# Patient Record
Sex: Male | Born: 1955 | Race: Black or African American | Hispanic: No | Marital: Single | Smoking: Current every day smoker
Health system: Southern US, Community
[De-identification: ages and names within clinical notes are randomized; demographics above are authoritative.]

## PROBLEM LIST (undated history)

## (undated) DIAGNOSIS — I5022 Chronic systolic (congestive) heart failure: Secondary | ICD-10-CM

## (undated) DIAGNOSIS — I509 Heart failure, unspecified: Secondary | ICD-10-CM

## (undated) DIAGNOSIS — G4733 Obstructive sleep apnea (adult) (pediatric): Secondary | ICD-10-CM

## (undated) DIAGNOSIS — I2510 Atherosclerotic heart disease of native coronary artery without angina pectoris: Secondary | ICD-10-CM

## (undated) DIAGNOSIS — I428 Other cardiomyopathies: Secondary | ICD-10-CM

## (undated) DIAGNOSIS — M1990 Unspecified osteoarthritis, unspecified site: Secondary | ICD-10-CM

## (undated) DIAGNOSIS — I10 Essential (primary) hypertension: Secondary | ICD-10-CM

## (undated) DIAGNOSIS — C801 Malignant (primary) neoplasm, unspecified: Secondary | ICD-10-CM

## (undated) DIAGNOSIS — I5032 Chronic diastolic (congestive) heart failure: Secondary | ICD-10-CM

## (undated) HISTORY — DX: Essential (primary) hypertension: I10

## (undated) HISTORY — DX: Morbid (severe) obesity due to excess calories: E66.01

## (undated) HISTORY — DX: Other cardiomyopathies: I42.8

## (undated) HISTORY — DX: Chronic systolic (congestive) heart failure: I50.22

## (undated) HISTORY — DX: Obstructive sleep apnea (adult) (pediatric): G47.33

## (undated) HISTORY — DX: Chronic diastolic (congestive) heart failure: I50.32

---

## 2002-05-19 ENCOUNTER — Encounter: Payer: Self-pay | Admitting: Cardiology

## 2002-05-19 ENCOUNTER — Inpatient Hospital Stay (HOSPITAL_COMMUNITY): Admission: EM | Admit: 2002-05-19 | Discharge: 2002-05-21 | Payer: Self-pay | Admitting: Cardiology

## 2004-01-12 ENCOUNTER — Encounter: Payer: Self-pay | Admitting: Cardiology

## 2004-08-02 ENCOUNTER — Ambulatory Visit: Payer: Self-pay | Admitting: Cardiology

## 2004-11-28 ENCOUNTER — Ambulatory Visit: Payer: Self-pay | Admitting: Cardiology

## 2005-03-19 ENCOUNTER — Ambulatory Visit: Payer: Self-pay | Admitting: Internal Medicine

## 2005-04-09 DIAGNOSIS — C801 Malignant (primary) neoplasm, unspecified: Secondary | ICD-10-CM

## 2005-04-09 HISTORY — PX: COLON SURGERY: SHX602

## 2005-04-09 HISTORY — DX: Malignant (primary) neoplasm, unspecified: C80.1

## 2005-04-10 ENCOUNTER — Ambulatory Visit: Payer: Self-pay | Admitting: Cardiology

## 2005-11-09 ENCOUNTER — Ambulatory Visit: Payer: Self-pay | Admitting: Cardiology

## 2005-11-19 ENCOUNTER — Ambulatory Visit: Payer: Self-pay | Admitting: Cardiology

## 2006-06-18 ENCOUNTER — Ambulatory Visit: Payer: Self-pay | Admitting: Cardiology

## 2007-02-24 ENCOUNTER — Ambulatory Visit: Payer: Self-pay | Admitting: Cardiology

## 2007-02-24 ENCOUNTER — Encounter: Payer: Self-pay | Admitting: Physician Assistant

## 2007-11-24 ENCOUNTER — Ambulatory Visit: Payer: Self-pay | Admitting: Cardiology

## 2007-11-24 ENCOUNTER — Encounter: Payer: Self-pay | Admitting: Physician Assistant

## 2008-02-16 ENCOUNTER — Encounter: Payer: Self-pay | Admitting: Cardiology

## 2008-08-03 ENCOUNTER — Ambulatory Visit: Payer: Self-pay | Admitting: Cardiology

## 2008-08-24 ENCOUNTER — Encounter: Payer: Self-pay | Admitting: Cardiology

## 2008-08-27 ENCOUNTER — Encounter: Payer: Self-pay | Admitting: Cardiology

## 2008-12-16 DIAGNOSIS — I10 Essential (primary) hypertension: Secondary | ICD-10-CM

## 2008-12-16 DIAGNOSIS — I5022 Chronic systolic (congestive) heart failure: Secondary | ICD-10-CM

## 2008-12-16 DIAGNOSIS — I429 Cardiomyopathy, unspecified: Secondary | ICD-10-CM | POA: Insufficient documentation

## 2010-01-19 ENCOUNTER — Ambulatory Visit: Payer: Self-pay | Admitting: Cardiology

## 2010-05-09 NOTE — Assessment & Plan Note (Signed)
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 The patient has been noncompliant with medical therapy and has developed some lower extremity edema and increased shortness of breath. His blood pressure also markedly elevated because of this lack of medications. He denies any palpitations or syncope. His spine to resume his medications tomorrow.  Preventive Screening-Counseling & Management  Alcohol-Tobacco     Smoking Status: current     Smoking Cessation Counseling: yes     Packs/Day: 1/2 PPD  Current Medications (verified): 1)  Aldactone 25 Mg Tabs (Spironolactone) .Marland Kitchen.. 1 Tablet By Mouth Once A Day 2)  Amlodipine Besylate 10 Mg Tabs (Amlodipine Besylate) .... Take 1 Tablet By Mouth Once A Day 3)  Furosemide 40 Mg Tabs (Furosemide) .... Take 1 Tablet By Mouth Once A Day 4)  Lisinopril 40 Mg Tabs (Lisinopril) .... Take 1 Tablet By Mouth Once A Day 5)  Metoprolol Tartrate 50 Mg Tabs (Metoprolol Tartrate) .... Take 1/2 Tablet By Mouth Twice A Day 6)  Potassium Chloride Crys Cr 20 Meq Cr-Tabs (Potassium Chloride Crys Cr) .... Take 1 Tablet By Mouth Once A Day 7)  Aspir-Low 81 Mg Tbec (Aspirin) .... Take 1 Tablet By Mouth Once A Day  Allergies (verified): 1)  Penicillin V Potassium (Penicillin V Potassium)  Comments:  Nurse/Medical Assistant: The patient's medications and allergies were verbally reviewed with the patient and were updated in the Medication and Allergy Lists.  Past History:  Past Medical History: Last updated: 12/16/2008 OBESITY-MORBID (>100') (ICD-278.01) HYPERTENSION, UNSPECIFIED (ICD-401.9) SYSTOLIC HEART FAILURE, CHRONIC (ICD-428.22) CARDIOMYOPATHY, SECONDARY (ICD-425.9) OSA  Social History: Last updated: 12/16/2008 Disabled  Tobacco Use - Yes.  Alcohol Use - no  Social History: Packs/Day:  1/2  PPD  Review of Systems       The patient complains of shortness of breath and leg swelling.  The patient denies fatigue, malaise, fever, weight gain/loss, vision loss, decreased hearing, hoarseness, chest pain, palpitations, prolonged cough, wheezing, sleep apnea, coughing up blood, abdominal pain, blood in stool, nausea, vomiting, diarrhea, heartburn, incontinence, blood in urine, muscle weakness, joint pain, rash, skin lesions, headache, fainting, dizziness, depression, anxiety, enlarged lymph nodes, easy bruising or bleeding, and environmental allergies.    Vital Signs:  Patient profile:   55 year old male Height:      65 inches Weight:      266 pounds BMI:     44.42 Pulse rate:   71 / minute BP sitting:   178 / 114  (left arm) Cuff size:   large  Vitals Entered By: Carlye Grippe (January 19, 2010 10:41 AM)  Nutrition Counseling: Patient's BMI is greater than 25 and therefore counseled on weight management options.  Physical Exam  Additional Exam:  General: Obese African American male head: Normocephalic and atraumatic eyes PERRLA/EOMI intact, conjunctiva and lids normal nose: No deformity or lesions mouth normal dentition, normal posterior pharynx neck: Supple, no JVD.  No masses, thyromegaly or abnormal cervical nodes lungs: Normal breath sounds bilaterally without wheezing.  Normal percussion heart: regular rate and rhythm with normal S1 and S2, no S3 or S4.  PMI is normal.  No pathological murmurs abdomen: Normal bowel sounds, abdomen is soft and nontender without masses, organomegaly or hernias noted.  No hepatosplenomegaly musculoskeletal: Back normal, normal gait muscle strength and tone normal pulsus: Pulse is normal in all 4 extremities Extremities:2+  peripheral pitting edema neurologic: Alert and oriented x 3 skin: Intact without lesions or rashes cervical nodes: No significant adenopathy psychologic: Normal affect    EKG  Procedure date:   01/19/2010  Findings:      normal sinus rhythm with first-degree AV block. Nonspecific ST-T wave changes.  Impression & Recommendations:  Problem # 1:  SYSTOLIC HEART FAILURE, CHRONIC (ICD-428.22) patient has evidence of volume overload but has been noncompliant with his medications. I asked him for his first dose of Lasix to take 40 mg twice a day and then resume 40 mg a day. His updated medication list for this problem includes:    Aldactone 25 Mg Tabs (Spironolactone) .Marland Kitchen... 1 tablet by mouth once a day    Amlodipine Besylate 10 Mg Tabs (Amlodipine besylate) .Marland Kitchen... Take 1 tablet by mouth once a day    Furosemide 40 Mg Tabs (Furosemide) .Marland Kitchen... Take 1 tablet by mouth once a day    Lisinopril 40 Mg Tabs (Lisinopril) .Marland Kitchen... Take 1 tablet by mouth once a day    Metoprolol Tartrate 50 Mg Tabs (Metoprolol tartrate) .Marland Kitchen... Take 1/2 tablet by mouth twice a day    Aspir-low 81 Mg Tbec (Aspirin) .Marland Kitchen... Take 1 tablet by mouth once a day  Problem # 2:  HYPERTENSION, UNSPECIFIED (ICD-401.9) BP markedly elevated but he should improve after initiation of his medications. Also asked the patient to stop his potassium supplements in conjunction with spironolactone. His updated medication list for this problem includes:    Aldactone 25 Mg Tabs (Spironolactone) .Marland Kitchen... 1 tablet by mouth once a day    Amlodipine Besylate 10 Mg Tabs (Amlodipine besylate) .Marland Kitchen... Take 1 tablet by mouth once a day    Furosemide 40 Mg Tabs (Furosemide) .Marland Kitchen... Take 1 tablet by mouth once a day    Lisinopril 40 Mg Tabs (Lisinopril) .Marland Kitchen... Take 1 tablet by mouth once a day    Metoprolol Tartrate 50 Mg Tabs (Metoprolol tartrate) .Marland Kitchen... Take 1/2 tablet by mouth twice a day    Aspir-low 81 Mg Tbec (Aspirin) .Marland Kitchen... Take 1 tablet by mouth once a day  Orders: EKG w/ Interpretation (93000)  Problem # 3:  CARDIOMYOPATHY, SECONDARY (ICD-425.9) patient has a nonischemic cardiomyopathy no further ischemia workup is needed. His updated medication list  for this problem includes:    Aldactone 25 Mg Tabs (Spironolactone) .Marland Kitchen... 1 tablet by mouth once a day    Amlodipine Besylate 10 Mg Tabs (Amlodipine besylate) .Marland Kitchen... Take 1 tablet by mouth once a day    Furosemide 40 Mg Tabs (Furosemide) .Marland Kitchen... Take 1 tablet by mouth once a day    Lisinopril 40 Mg Tabs (Lisinopril) .Marland Kitchen... Take 1 tablet by mouth once a day    Metoprolol Tartrate 50 Mg Tabs (Metoprolol tartrate) .Marland Kitchen... Take 1/2 tablet by mouth twice a day    Aspir-low 81 Mg Tbec (Aspirin) .Marland Kitchen... Take 1 tablet by mouth once a day  Orders: EKG w/ Interpretation (93000)  Patient Instructions: 1)  Resume medications as discussed with Dr. Earnestine Leys. Take Lasix (furosemide) 40mg  two times a day for 1 day and then resume 40mg  once daily. 2)  Stop Potassium.  3)  Your physician wants you to follow-up in: 6 months. You will receive a reminder letter in the mail one-two months in advance. If you don't receive a letter, please call our office to schedule the follow-up appointment. 4)  Your physician discussed the hazards of tobacco use.  Tobacco use cessation is recommended and techniques and options to  help you quit were discussed.

## 2010-08-17 ENCOUNTER — Other Ambulatory Visit: Payer: Self-pay | Admitting: *Deleted

## 2010-08-17 MED ORDER — LISINOPRIL 40 MG PO TABS: 40.0000 mg | ORAL_TABLET | Freq: Every day | ORAL | Status: DC

## 2010-08-22 NOTE — Assessment & Plan Note (Signed)
[facility]                          [facility] NOTE   NAME:[name] [name], [name] [name]                   MRN:          [id number]  DATE:08/03/2008                            DOB:          [birth year]    HISTORY OF PRESENT ILLNESS:  The patient is a very pleasant 55 year old  African American male with a nonischemic cardiomyopathy; however, his  ejection fraction more recently in August 2007 was approximately 50%.  At one point, he had severe LV dysfunction with ejection fraction of 10-  15%, likely secondary to hypertensive heart disease.  He is currently at  NYHA class I-II.  He denies any chest pain or shortness of breath.  His  main complaint is that he is bothered by bilateral hip pain and is going  to see an orthopedic physician for further evaluation.  Unfortunately,  he continues to smoke 2 packs every 2 days.  From a cardiovascular  standpoint; however, he has remained stable.   MEDICATIONS:  1. Aldactone 25 mg p.o. daily.  2. Zestril 40 mg p.o. daily.  3. Lasix 40 mg p.o. daily.  4. Aspirin 81 mg p.o. daily.  5. Lopressor 50 mg half tablet p.o. b.i.d.  6. K-Dur 20 mEq.  7. Norvasc 10 mg p.o. daily.   PHYSICAL EXAMINATION:  VITAL SIGNS:  Blood pressure 106/73, heart rate  70, and weight 254 pounds.  NECK:  Normal carotid upstroke and no carotid bruits.  No thyromegaly.  No nodular thyroid.  LUNGS:  Clear breath sounds bilaterally.  HEART:  Regular rate and rhythm with normal S1 and S2.  No murmur, rub,  or gallop.  ABDOMEN:  Soft, nontender.  No rebound or guarding.  Good bowel sounds.  EXTREMITIES:  No cyanosis, clubbing, or edema.  NEURO:  The patient is alert, oriented, and grossly nonfocal.  EXTREMITIES:  Otherwise within normal limits.   PROBLEMS LIST:  1. Nonischemic cardiomyopathy.      a.     Ejection fraction 50%.      b.     Ejection fraction in 2004, 10-15%.      c.     Normal coronary arteries in February 2004.  2.  Chronic systolic heart failure without any recent exacerbation, [place]      [place] Heart Association class I to II.  3. Long-standing tobacco abuse.  4. Hypertension, controlled.  5. Morbid obesity.  6. Obstructive sleep apnea, noncompliant.   PLAN:  1. I have asked the patient to be compliant with the CPAP and also      stop smoking.  2. He needs some blood work done, will include CBC, CMET, and a BNP.  3. The patient's blood pressure is well controlled as long as he      remains compliant with his medication but sometimes difficult      because of financial constraints.  However, I do not think he needs      ischemia workup at this point in time.     Learta Codding, MD,FACC  Electronically Signed    GED/MedQ  DD: 08/03/2008  DT: 08/04/2008  Job #: [id number]

## 2010-08-22 NOTE — Assessment & Plan Note (Signed)
[facility]                          [facility] NOTE   NAME:[name] [name], [name] [name]                   MRN:          [id number]  DATE:02/24/2007                            DOB:          06-26-1955    PRIMARY CARDIOLOGIST:  Learta Codding, MD,FACC   REASON FOR VISIT:  A 6 month follow up.   HISTORY OF PRESENT ILLNESS:  Since last seen here in the clinic this  past March, the patient continues to report no exertional angina  pectoris or tachy palpitations.  He also presents with no sign/symptoms  suggestive of decompensated heart failure.  Although he reports  compliance with his medications, he did not take his morning doses of  medications today.  He also continues to smoke.   The patient has not had any blood work since January this year.  He is  on Aldactone as well as supplemental potassium, and his previous  potassium level was 3.9.   The patient does not weigh himself daily, but does keep a check on his  volume status.  He states that he has not had any recurrent lower  extremity edema, as when he initially presented with congestive heart  failure in 2004.  He also does admit to some occasional dietary sodium  indiscretion.   Electrocardiogram today reveals NSR with first degree AV block at 90 bpm  and persistent nonspecific ST abnormalities in the inferior lateral  leads, somewhat more pronounced than from a year ago.   CURRENT MEDICATIONS:  1. Aldactone 25 mg daily.  2. Lopressor 25 mg b.i.d.  3. Norvasc 5 mg daily.  4. Zestril 40 mg daily.  5. Lasix 40 mg daily.  6. K-Dur 20 q.o.d.  7. Aspirin 81 mg daily.  8. CPAP p.r.n.   PHYSICAL EXAMINATION:  VITAL SIGNS:  Blood pressure 137/90, pulse 88 and  regular, weight 260.6 (up three pounds).  GENERAL:  A 55 year old male, morbidly obese, sitting upright in no  distress.  HEENT:  Normocephalic, atraumatic.  NECK:  Palpable bilateral carotid pulses; unable to assess JVD  secondary  to neck girth.  LUNGS:  Diminished breath sounds at bases but without crackles or  wheezes.  HEART:  Regular rate and rhythm (S1, S2).  No significant murmurs.  No  rubs.  ABDOMEN:  Protuberant, nontender.  EXTREMITIES:  No pedal edema.  NEUROLOGICAL:  No focal deficits.   IMPRESSION:  1. Nonischemic cardiomyopathy.      a.     Ejection fraction 50%; no focal WMAs by 2D echo, August       2007.      b.     History of severe LVD (ejection fraction 50%) with normal       coronary arteries by cardiac catheterization in February, 2004.  2. Chronic systolic heart failure.      a.     Current NYHA class I-II.  3. Long-standing tobacco.  4. Hypertension.  5. History of renal insufficiency.  6. Morbid obesity.  7. History of colon cancer status post right hemicolectomy, December      2006.  8. Obstructive sleep apnea,  CPAP noncompliant.   PLAN:  1. Complete surveillance blood work with C-Met, BMP, CBC and TSH      levels.  2. Prescribe Chantix for aggressive smoking cessation treatment.  The      patient states that he has never tried a treatment modality in the      past, but is quite willing to do so at the present time.  3. Patient strongly encouraged to establish with a primary care      physician.  4. Return clinic follow up with myself and Dr. Andee Lineman in six months.      Gene Serpe, PA-C  Electronically Signed      Learta Codding, MD,FACC  Electronically Signed   GS/MedQ  DD: 02/24/2007  DT: 02/25/2007  Job #: [id number]

## 2010-08-22 NOTE — Assessment & Plan Note (Signed)
[facility]                          [facility]   NAME:[name] [name] HARN                   MRN:          [id number]  DATE:11/24/2007                            DOB:          [birth year]    PRIMARY CARDIOLOGIST:  Learta Codding, MD, [facility]   REASON FOR VISIT:  Six-month followup.   Since last seen here in the clinic in November 2008, [name] [name]  states that he is feeling the best in years.  He reports compliance  with his medications and refrains from any added salt in his diet.  Unfortunately, however, he continues to smoke, but now only smokes  approximately 1 pack a week.  He previously smoked as much as 2 packs a  day.   The patient denies any exertional chest discomfort.  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  He  has not yet had a formal evaluation for this, however.  The patient is  on total disability.   [name] [name] denies any recurrent PND or orthopnea.  He has stable  lower extremity edema, much improved as compared to previous  presentations.   The patient has not had any repeat blood work, since my last orders in  November.  At that time, CBC, renal function, electrolytes, and TSH were  all within normal limits.  Of note, BNP level was 20.   EKG today reveals an SR at 84 bpm with chronic first-degree AV block;  chronic, diffuse ST-segment abnormalities.   CURRENT MEDICATIONS:  1. Aspirin 81 daily.  2. Lasix 40 daily.  3. K-Dur 20 every other day.  4. Zestril 40 daily.  5. Norvasc 5 daily.  6. Aldactone 25 daily.  7. Lopressor 25 b.i.d.   PHYSICAL EXAMINATION:  VITAL SIGNS:  Blood pressure 130/83, pulse 86,  regular; weight 253 (down 7).  GENERAL:  A 55 year old male, moderately obese, sitting upright, in no  distress.  HEENT:  Normocephalic, atraumatic.  NECK:  Palpable.  Carotid pulses without bruits; no JVD at 90 degrees.  LUNGS:  Clear to auscultation  bilaterally.  HEART:  RRR (S1, S2), positive S4.  No significant murmurs.  No rubs.  ABDOMEN:  Protuberant, nontender.  EXTREMITIES:  Palpable posterior tibialis pulses with 1+ pedal edema.  NEURO:  No focal deficits.   IMPRESSION:  1. Nonischemic cardiomyopathy.  1a.  Ejection fraction 50%; no focal WMAs by echocardiogram, August  2007.  1b.  History of severe left ventricular dysfunction (ejection fraction  10-15% by 2D echocardiogram in 2004).  1c.  Normal coronary arteries, February 2004.  1. Chronic systolic heart failure.  2a.  Current [place] Heart Association Class I-II.  1. Longstanding tobacco.  2. Hypertension.  3. Morbid obesity.  4. Obstructive sleep apnea, CPAP noncompliant.   PLAN:  1. Uptitrate Norvasc to 10 mg daily for more aggressive blood pressure      control.  2. Surveillance blood work with a CMET.  3. Return clinic followup with myself again in 6 months.      Gene Serpe, PA-C  Electronically Signed  Madolyn Frieze Jens Som, MD, [facility]  Electronically Signed   GS/MedQ  DD: 11/24/2007  DT: 11/25/2007  Job #: [id number]

## 2010-08-25 NOTE — Discharge Summary (Signed)
NAME:  [name] [name], [name] [name]                    ACCOUNT NO.:  [id number]   MEDICAL RECORD NO.:  [id number]                   PATIENT TYPE:  INP   LOCATION:  2031                                 FACILITY:  [facility]   PHYSICIAN:  R[place]ide Bing, M.D. [facility]           DATE OF BIRTH:  [birth year]   DATE OF ADMISSION:  05/19/2002  DATE OF DISCHARGE:  05/21/2002                           DISCHARGE SUMMARY - REFERRING   DISCHARGE DIAGNOSES:  1. 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  2. Hypertension, treated.  3. Remote history of asthma.  4. Tobacco abuse.   HOSPITAL COURSE:  The patient is a 55 year old male patient who was admitted  to [facility] on May 17, 2002 under the service of Dr. Linna Darner.  He  was admitted with shortness of breath and lower extremity edema.  During  that admission, he was noted to have markedly elevated blood pressures and  troponin levels were mildly elevated at 0.09.  His 12-lead EKG revealed  normal sinus rhythm with nonspecific ST-T wave changes.  Ultimately, a  cardiac consultation was obtained on May 18, 2002 and a 2-D  echocardiogram revealed an EF of about 15%.  On May 19, 2002, the  patient was transferred to [facility] and the following day, underwent  coronary angiography under the care of Dr. Charlton Haws.  He was found to  have normal coronary arteries but the LV was not performed secondary to  increased filling pressures.  Again, a 15% EF was noted by echocardiogram.   His nonischemic cardiomyopathy was felt to be secondary to hypertension and  this was aggressively treated.  In addition, he was noted to have  hypokalemia during his admission and this was aggressively treated.   By discharge, his blood pressure was 110/80, pulse 88, respirations 20, O2  saturation 99% on room air.  He was planned for discharge to home on the  following medications:   DISCHARGE  MEDICATIONS:  1. Aldactone 25 mg a day.  2. Enteric-coated aspirin 325 mg a day.  3. Lopressor 50 mg one p.o. b.i.d.  4. Norvasc 5 mg one p.o. daily.  5. Zestril 40 mg one p.o. daily.  6. Lasix 40 mg one p.o. daily.  7. Potassium 20 mEq two tablets daily.  8. Tylenol one to two tablets every six hours as needed for pain.   ACTIVITY:  Activity as tolerated.   DIET:  Remain on a low-fat, no-salt diet.  Limit fluid intake to 2 L of  fluid per day.    FOLLOWUP:  Call for any questions or concerns and follow up with [facility] in [place] on May 26, 2002 at 1:30 p.m.  At this point, he will  need to have a BMET checked to ensure his hypokalemia has not recurred.     Guy Franco, P.A. [facility]  [place] Bing, M.D. [facility]    LB/MEDQ  D:  05/21/2002  T:  05/21/2002  Job:  [id number]   cc:   [address], [address], C/o [facility], Jonelle Sidle M.D., [place], [place]  [address]   [address], [place], [place]  [address] Stormy Card.D.

## 2010-08-25 NOTE — Cardiovascular Report (Signed)
   NAME:  [name] [name], [name] [name]                    ACCOUNT NO.:  [id number]   MEDICAL RECORD NO.:  [id number]                   PATIENT TYPE:  INP   LOCATION:  2031                                 FACILITY:  [facility]   PHYSICIAN:  Charlton Haws, M.D.                  DATE OF BIRTH:  07-14-1955   DATE OF PROCEDURE:  DATE OF DISCHARGE:  05/21/2002                              CARDIAC CATHETERIZATION   INDICATIONS FOR PROCEDURE:  Cardiomyopathy, rule out ischemic nature.   DESCRIPTION OF PROCEDURE:  The catheterization is done from the right  femoral artery and vein. The left main coronary artery was normal. The left  anterior descending artery was normal. The circumflex coronary was normal.  The right coronary artery was normal.   The patient had elevated filling pressures and a ventriculography was not  performed. He had an ejection fraction of 15% by echocardiography. Right  heart catheterization was done to assess his cardiomyopathy, and adequacy of  medical therapy. Mean right atrial pressure is 20, RV pressure is 56/21, PA  pressure is 52/35, meal pulmonary capillary wedge pressure was 30. Cardiac  index was 3.1 liters per minute/m2.   IMPRESSION:  The patient has a nonischemic cardiomyopathy, probably  hypertensive in nature. He will need his medications increased  and adjusted  appropriately, since his filling pressures are still high. He will probably  be able to be discharged in about 48 hours once he has been diuresed more  and his medications are adjusted. He will follow up in [place] with Dr.  Diona Browner.                                               Charlton Haws, M.D.    PN/MEDQ  D:  09/07/2002  T:  09/07/2002  Job:  [id number]   cc:   Dr. Juventino Slovak

## 2010-08-25 NOTE — Assessment & Plan Note (Signed)
[name] [name]                          [name] [name]   NAME:[name], [name] [name] [name]                   MRN:          [id number]  DATE:06/18/2006                            DOB:          [birth year]    PRIMARY CARDIOLOGIST:  Dr. Lewayne Bunting.   REASON FOR VISIT:  Scheduled 90-month followup.  Please refer to my  previous clinic [name] of August, 2007 for full details.   The patient reports no interim development of signs or symptoms  suggestive of unstable angina pectoris or decompensated heart failure.  He reports compliance with his medications but, unfortunately, continues  to smoke, albeit at a significantly reduced level.   The patient had recent blood work in January of this year, per Dr. Cleone Slim,  and this shows normal renal function, potassium of 3.9, and normal CBC.   The patient is followed by Dr. Cleone Slim for a history of colon cancer and is  status post right hemicolectomy.   CURRENT MEDICATIONS:  1. Aldactone 25 daily.  2. Lopressor 50 daily.  3. Norvasc 5 daily.  4. Zestril 40 daily.  5. Lasix 40 daily.  6. K-Dur 20 daily.  7. Aspirin 81 daily.  8. CPAP p.r.n.   PHYSICAL EXAMINATION:  Blood pressure 120/78, pulse 81 and regular.  Weight 257.8 (down 2 pounds).  GENERAL:  A 55 year old male, morbidly obese.  Sitting upright in no  distress.  NECK:  Palpable carotid pulses without bruits; no JVD.  LUNGS:  Clear to auscultation in all fields.  HEART:  Regular rate and rhythm (S1, S2).  No significant murmurs.  No  rubs.  ABDOMEN:  Protuberant.  Nontender.  EXTREMITIES:  No edema.  NEURO:  No focal deficit.   IMPRESSION:  1. Nonischemic cardiomyopathy.      a.     Ejection fraction 50% by 2D echo August of 2007.      b.     History of severe left ventricular dysfunction with ejection       fraction of 15% by cardiac catheterization 2004.      c.     Previously in WARCEF study (discontinued secondary to       improved ejection  fraction).  2. Hypertensive heart disease.  3. Morbid obesity.  4. History of renal insufficiency.      a.     Normalized by most recent blood work.  5. Ongoing tobacco.  6. History of colon cancer.      a.     Status post right hemicolectomy.   PLAN:  1. Change metoprolol to 25 b.i.d. for more appropriate dosing regimen.      Otherwise, continue current medication regimen.  2. Schedule return clinic followup with myself and Dr. Andee Lineman in 6      months.  3. Of [name], the patient once again strongly encouraged to stop smoking      tobacco.  He states that if he has not done so by the time of our      next visit, he is amenable to trying Chantix.  4. The patient also strongly encouraged to establish with a  primary      care physician.      Gene Serpe, PA-C  Electronically Signed      Learta Codding, MD,FACC  Electronically Signed   GS/MedQ  DD: 06/18/2006  DT: 06/20/2006  Job #: [id number]

## 2010-08-25 NOTE — Cardiovascular Report (Signed)
NAME:  [name] [name], [name] [name] NO.:  [id number]   MEDICAL RECORD NO.:  [id number]                   PATIENT TYPE:   LOCATION:                                       FACILITY:  [facility]   PHYSICIAN:  Charlton Haws, M.D. [facility]              DATE OF BIRTH:   DATE OF PROCEDURE:  05/20/2002  DATE OF DISCHARGE:                              CARDIAC CATHETERIZATION   PROCEDURE:  Coronary arteriography.   INDICATION:  Dyspnea, left-sided cardiac failure with EF of 15% by  echocardiogram.   DESCRIPTION OF PROCEDURE:  Cine catheterization was done from the right  femoral artery and vein.  Right heart catheterization was performed due to  the patient's low ejection fraction, clinical CHF and BNP greater than 900.   FINDINGS:  The left main coronary artery was normal.   Left anterior descending artery was normal.   Circumflex coronary artery was nondominant and normal.   Right coronary artery was dominant and it was normal.   Ventriculography was not performed due to the patient's high filling  pressure.  Mean right atrial pressure was 20.  RV pressure was 56/21.  PA  pressure was 52/35.  Mean pulmonary capillary wedge pressure was 30.  Cardiac index was in the 3.1 L/min per square meter range.   IMPRESSION:  The patient appeared to have nonischemic cardiomyopathy, most  likely from poor compliance with his blood pressure medications.   His filling pressures are still somewhat high.  We will increase his  Lisinopril to 40 mg a day and put him on b.i.d. Lasix with potassium  supplementation.  As long as his groin heals well, I suspect he will be  ready for discharge in 48 hours and he can follow up with Dr. Linna Darner and Dr.  Jonelle Sidle in [place].   The patient tolerated the procedure well.                                               Charlton Haws, M.D. [facility]    PN/MEDQ  D:  05/20/2002  T:  05/20/2002  Job:  [id number]

## 2010-08-25 NOTE — Assessment & Plan Note (Signed)
[facility]                            [facility] OFFICE NOTE   NAME:[name], [name]                    MRN:          [id number]  DATE:11/09/2005                            DOB:          08-26-1955    REASON FOR OFFICE VISIT:  [name] [name] is a 55 year old male, with a history  of non-ischemic cardiomyopathy, who now presents for a scheduled six month  followup.   The patient was last seen here in the office in January 2007, by Arnette Felts,  P.A.-C., at which time he was taken off Coumadin as part of the [facility]  study, given that his ejection fraction had improved from as low as 10-15%  to a most recent level of 45-55%.  He was kept on low dose aspirin.   The patient's history also notable for hypertension, sleep apnea, renal  insufficiency, and ongoing tobacco smoking.   The patient also underwent recent right hemicolectomy for treatment of colon  cancer in December 2006.   The patient had a catheterization in February 2004, revealing  angiographically normal coronary arteries with an ejection fraction of 15%.  It was felt that his cardiomyopathy was most likely secondary to  uncontrolled hypertension.   The patient reports compliance with his medications, but continues to smoke.  He is unemployed and is on total disability secondary to his cardiomyopathy.  He denies any exertional angina pectoris and reports no symptoms suggestive  of decompensated heart failure.  He has occasional episodic periods of  dizziness, approximately every few months, but these appear to be vasovagal  in etiology:  He describes the sensation of nausea associated with vomiting  which precedes these.  There is no associated tachy-palpitations nor any  chest pain.   Electrocardiogram today reveals normal sinus rhythm with first degree atrial-  ventricular block at 90 BPM, with left axis deviation and nonspecific ST  abnormalities.   CURRENT MEDICATIONS:  1.   Aldactone 25 mg daily.  2.  Lopressor 50 mg daily.  3.  Norvasc 5 mg daily.  4.  Zestril 40 mg daily.  5.  Lasix 40 mg daily.  6.  K-Dur 20 mEq daily.  7.  Aspirin 81 mg daily.  8.  CPAP on occasion.   PHYSICAL EXAMINATION:  VITAL SIGNS:  Blood pressure 140/80, pulse 90 and  regular, weight 259.8 (down two pounds).  GENERAL:  A 55 year old male, obese, sitting upright in no distress.  NECK:  Palpable carotid pulses without bruits;  unable to assess for JVD  secondary to neck girth.  LUNGS:  Diminished breath sounds at bases, though without crackles or  wheezes.  HEART:  Regular rate and rhythm (S1 and S2), positive S4, no S3.  No  significant murmurs.  ABDOMEN:  Protuberant, nontender.  EXTREMITIES:  Palpable dorsalis pedis pulses with trace edema.   IMPRESSION:  1.  Non-ischemic cardiomyopathy.      1.  History of severe left ventricular dysfunction (15% by          catheterization), improved to 45-55% by most recent echocardiogram  of May 2005.  2.  Hypertensive heart disease.  3.  Status post WARCEF study.      1.  Coumadin discontinued secondary to improved ejection fraction.  4.  Morbid obesity.  5.  History of renal insufficiency.  6.  Ongoing tobacco.  7.  History of colon cancer.      1.  Status post right hemicolectomy.   PLAN:  1.  Schedule 2-D echocardiogram for reassessment of left ventricular      function.  2.  Check a complete metabolic profile at that time for close monitoring of      electrolytes and renal function given that he is on Aldactone, Lasix,      and an ACE inhibitor.  3.  We will up-titrate Lopressor to 50 mg b.i.d. for better blood pressure      and basal heart rate control.  4.  Schedule a return clinic followup in six months with Dr. Lewayne Bunting.  5.  I also strongly encouraged [name] [name] to stop smoking tobacco on his      own, and to initiate a regular walking regimen for blood pressure and      weight control.                                    Gene Serpe, PA-C                                Learta Codding, MD, [facility]   GS/MedQ  DD:  11/09/2005  DT:  11/09/2005  Job #:  [id number]

## 2010-11-09 ENCOUNTER — Encounter: Payer: Self-pay | Admitting: Cardiology

## 2010-11-22 ENCOUNTER — Encounter: Payer: Self-pay | Admitting: Cardiology

## 2010-11-22 ENCOUNTER — Ambulatory Visit (INDEPENDENT_AMBULATORY_CARE_PROVIDER_SITE_OTHER): Payer: Medicare Other | Admitting: Cardiology

## 2010-11-22 VITALS — BP 157/95 | HR 76 | Ht 65.0 in | Wt 265.0 lb

## 2010-11-22 DIAGNOSIS — I10 Essential (primary) hypertension: Secondary | ICD-10-CM

## 2010-11-22 DIAGNOSIS — G4733 Obstructive sleep apnea (adult) (pediatric): Secondary | ICD-10-CM

## 2010-11-22 DIAGNOSIS — I5022 Chronic systolic (congestive) heart failure: Secondary | ICD-10-CM

## 2010-11-22 MED ORDER — CHLORTHALIDONE 25 MG PO TABS: 25.0000 mg | ORAL_TABLET | Freq: Every day | ORAL | Status: DC

## 2010-11-22 MED ORDER — CARVEDILOL 6.25 MG PO TABS: 6.2500 mg | ORAL_TABLET | Freq: Two times a day (BID) | ORAL | Status: DC

## 2010-11-22 NOTE — Assessment & Plan Note (Signed)
I also asked the patient to resume his CPAP device given his history of obstructive sleep apnea.

## 2010-11-22 NOTE — Progress Notes (Signed)
HPI The patient is a very pleasant 55 year old male with history of nonischemic cardiomyopathy, latest ejection fraction 50%. In the past the patient had been noncompliant with his medical therapy but states that now he takes his medications on a regular basis. Unfortunately, he is again hypertensive come to the clinic today. When I asked him what his usual blood pressures at home were he responded to me that they were typically around 160-170 mmHg and tried to explain to him that this is significant blood pressure elevation. The patient also has a history of obstructive sleep apnea but does not use his CPAP device in several years. This was initially prescribed by Arnette Felts in  our office. We performed a bedside echocardiogram which showed an ejection fraction 50-55%, without segmental wall motion abnormalities, but with some right ventricular enlargement albeit normal RV function. Mitral and aortic valve appear to be within normal limits.  Allergies  Allergen Reactions  . Penicillins     REACTION: rash    Current Outpatient Prescriptions on File Prior to Visit  Medication Sig Dispense Refill  . amLODipine (NORVASC) 10 MG tablet Take 10 mg by mouth daily.        Marland Kitchen aspirin 81 MG EC tablet Take 81 mg by mouth daily.        Marland Kitchen lisinopril (PRINIVIL,ZESTRIL) 40 MG tablet Take 1 tablet (40 mg total) by mouth daily. PATIENT NEED OFFICE VISIT  30 tablet  0    Past Medical History  Diagnosis Date  . Morbid obesity   . HTN (hypertension)   . Chronic systolic heart failure   . Cardiomyopathy     secondary   . OSA (obstructive sleep apnea)     No past surgical history on file.  No family history on file.  History   Social History  . Marital Status: Single    Spouse Name: N/A    Number of Children: N/A  . Years of Education: N/A   Occupational History  . Not on file.   Social History Main Topics  . Smoking status: Current Everyday Smoker -- 0.5 packs/day for 40 years    Types:  Cigarettes  . Smokeless tobacco: Never Used  . Alcohol Use: No  . Drug Use: Not on file  . Sexually Active: Not on file   Other Topics Concern  . Not on file   Social History Narrative   Disabled.    VHQ:IONGEXBMW positives as outlined above. The remainder of the 18  point review of systems is negative  PHYSICAL EXAM BP 157/95  Pulse 76  Ht 5\' 5"  (1.651 m)  Wt 265 lb (120.203 kg)  BMI 44.10 kg/m2  SpO2 96%  General: Well-developed, well-nourished in no distress Head: Normocephalic and atraumatic Eyes:PERRLA/EOMI intact, conjunctiva and lids normal Ears: No deformity or lesions Mouth:normal dentition, normal posterior pharynx Neck: Supple, no JVD.  No masses, thyromegaly or abnormal cervical nodes Lungs: Normal breath sounds bilaterally without wheezing.  Normal percussion Cardiac: regular rate and rhythm with normal S1 and S2, no S3 or S4.  PMI is normal.  No pathological murmurs Abdomen: Normal bowel sounds, abdomen is soft and nontender without masses, organomegaly or hernias noted.  No hepatosplenomegaly MSK: Back normal, normal gait muscle strength and tone normal Vascular: Pulse is normal in all 4 extremities Extremities: No peripheral pitting edema Neurologic: Alert and oriented x 3 Skin: Intact without lesions or rashes Lymphatics: No significant adenopathyPsychologic: Normal affect  ECG: Not available  Limited bedside echocardiogram: See details above  ASSESSMENT AND PLAN

## 2010-11-22 NOTE — Assessment & Plan Note (Signed)
We'll DC Lasix and change to chlorthalidone 25 mg a day for better blood pressure control I told the patient however did careful monitor his blood pressure as well as his weight and if there is a significant increase in weight or edema he needs to call her office and we can give him additional when necessary doses of Lasix. I also stop metoprolol in favor of carvedilol 6.25 mg by mouth twice a day which can be further uptitrate as needed.

## 2010-11-22 NOTE — Assessment & Plan Note (Signed)
Echocardiogram stable and essentially near normal LV function

## 2010-11-22 NOTE — Patient Instructions (Addendum)
   Stop Lasix   Change to Chlorthalidone 25mg  daily  Stop Metoprolol   Change to Carvedilol 6.25mg  twice a day  Patient to call with blood pressure readings after one week on medications.  Resume BIPAP Your physician wants you to follow up in: 6 months.  You will receive a reminder letter in the mail one-two months in advance.  If you don't receive a letter, please call our office to schedule the follow up appointment

## 2011-01-18 ENCOUNTER — Other Ambulatory Visit: Payer: Self-pay | Admitting: *Deleted

## 2011-01-18 MED ORDER — LISINOPRIL 40 MG PO TABS: 40.0000 mg | ORAL_TABLET | Freq: Every day | ORAL | Status: DC

## 2011-09-20 ENCOUNTER — Other Ambulatory Visit: Payer: Self-pay | Admitting: Cardiology

## 2011-11-19 ENCOUNTER — Encounter: Payer: Self-pay | Admitting: Cardiology

## 2011-11-19 ENCOUNTER — Ambulatory Visit (INDEPENDENT_AMBULATORY_CARE_PROVIDER_SITE_OTHER): Payer: Medicare Other | Admitting: Cardiology

## 2011-11-19 VITALS — BP 110/80 | HR 77 | Ht 65.0 in | Wt 245.2 lb

## 2011-11-19 DIAGNOSIS — G4733 Obstructive sleep apnea (adult) (pediatric): Secondary | ICD-10-CM

## 2011-11-19 DIAGNOSIS — I10 Essential (primary) hypertension: Secondary | ICD-10-CM

## 2011-11-19 DIAGNOSIS — M25559 Pain in unspecified hip: Secondary | ICD-10-CM

## 2011-11-19 DIAGNOSIS — I429 Cardiomyopathy, unspecified: Secondary | ICD-10-CM

## 2011-11-19 MED ORDER — CARVEDILOL 6.25 MG PO TABS: 6.2500 mg | ORAL_TABLET | Freq: Two times a day (BID) | ORAL | Status: DC

## 2011-11-19 NOTE — Assessment & Plan Note (Signed)
Patient uses CPAP only intermittently. I tried to encourage her to use that much as possible

## 2011-11-19 NOTE — Assessment & Plan Note (Signed)
Patient is doing well. No heart failure exacerbation each requiring admission to the ER or hospitalization. Patient has been compliant with his medications. He has no orthopnea PND or shortness of breath. We will continue his current medications and refill carvedilol. No need for followup echocardiogram at this point

## 2011-11-19 NOTE — Assessment & Plan Note (Signed)
Patient has lost 25 pounds and encourage him to lose more weight by decreasing his carbohydrate and increasing his exercise pattern

## 2011-11-19 NOTE — Assessment & Plan Note (Addendum)
Chronic but worsened. Patient has appointment the next few days with the orthopedic surgeons. He may need preoperative clearance. We'll await further recommendations from surgeons.

## 2011-11-19 NOTE — Progress Notes (Signed)
[name] Bottoms, MD, [facility] Board Certified in Adult Cardiovascular Medicine,Internal Medicine and Critical Care Medicine    CC:   History of nonischemic cardiomyopathy                                                                               HPI:    Patient is a 56 year old male with a history of nonischemic cardiomyopathy. He has been doing well. He has not required any hospitalizations for heart failure. He reports no orthopnea PND palpitations or syncope. He has lost approximately 25 pounds but still is overweight. He also sleep apnea and uses CPAP only intermittently. He is right ventricular enlargement by echocardiogram, albeit normal RV function in his left ventricular function is significantly improved after better blood pressure control. Last office visit he was very hypertensive but during his office visit his blood pressure stable. Clinically he only reports symptoms of left hip pain for which he is going to see the orthopedic surgeons. No other cardiovascular complaints.       PMH: reviewed and listed in Problem List in Electronic Records (and see below) Past Medical History  Diagnosis Date  . Morbid obesity   . HTN (hypertension)   . Chronic systolic heart failure   . Cardiomyopathy     secondary   . OSA (obstructive sleep apnea)    No past surgical history on file.  Allergies/SH/FHX : available in Electronic Records for review  Allergies  Allergen Reactions  . Penicillins     REACTION: rash   History   Social History  . Marital Status: Single    Spouse Name: N/A    Number of Children: N/A  . Years of Education: N/A   Occupational History  . Not on file.   Social History Main Topics  . Smoking status: Current Everyday Smoker -- 0.5 packs/day for 40 years    Types: Cigarettes  . Smokeless tobacco: Never Used  . Alcohol Use: No  . Drug Use: Not on file  . Sexually Active: Not on file   Other Topics Concern  . Not on file   Social History  Narrative   Disabled.    No family history on file.  Medications: Current Outpatient Prescriptions  Medication Sig Dispense Refill  . amLODipine (NORVASC) 10 MG tablet Take 10 mg by mouth daily.        Marland Kitchen aspirin 81 MG EC tablet Take 81 mg by mouth daily.        . carvedilol (COREG) 6.25 MG tablet Take 1 tablet (6.25 mg total) by mouth 2 (two) times daily.  60 tablet  6  . chlorthalidone (HYGROTON) 25 MG tablet Take 1 tablet (25 mg total) by mouth daily.  30 tablet  2  . lisinopril (PRINIVIL,ZESTRIL) 40 MG tablet Take 1 tablet (40 mg total) by mouth daily. CALL OFFICE WITH BP READINGS  30 tablet  6    ROS: No nausea or vomiting. No fever or chills.No melena or hematochezia.No bleeding.No claudication  Physical Exam: BP 110/80  Pulse 77  Ht 5\' 5"  (1.651 m)  Wt 245 lb 4 oz (111.245 kg)  BMI 40.81 kg/m2 General: Overweight African American male in no distress  Neck: Normal carotid upstroke no carotid bruits no thyromegaly nonnodular thyroid. JVP difficult to evaluate Lungs: Clear breath sounds bilaterally Cardiac: Regular rate and rhythm normal S1-S2 no murmur rubs or gallops. No S3 Vascular: No edema. Normal distal pulses Skin: Warm and dry Physcologic: Normal affect  12lead ECG: normal sinus rhythm borderline left ventricular hypertrophy nonspecific T-wave changes.   Limited bedside ECHO:N/A No images are attached to the encounter.   I reviewed and summarized the old records. I reviewed ECG and prior blood work.  Assessment and Plan  CARDIOMYOPATHY, SECONDARY Patient is doing well. No heart failure exacerbation each requiring admission to the ER or hospitalization. Patient has been compliant with his medications. He has no orthopnea PND or shortness of breath. We will continue his current medications and refill carvedilol. No need for followup echocardiogram at this point  HYPERTENSION, UNSPECIFIED Blood pressure very well controlled after changing his medications in the  last office visit the patient monitors his blood pressure occasionally in the emergency room. Patient does need followup laboratory work given the fact that he is on ACE inhibitors and does not have a primary care physician when he gets routine blood work. I urged him to try to identify a primary care physician as soon as possible  Obstructive sleep apnea Patient uses CPAP only intermittently. I tried to encourage her to use that much as possible  OBESITY-MORBID (>100') Patient has lost 25 pounds and encourage him to lose more weight by decreasing his carbohydrate and increasing his exercise pattern  Hip pain Chronic but worsened. Patient has appointment the next few days with the orthopedic surgeons. He may need preoperative clearance. We'll await further recommendations from surgeons.    Patient Active Problem List  Diagnosis  . OBESITY-MORBID (>100')  . HYPERTENSION, UNSPECIFIED  . CARDIOMYOPATHY, SECONDARY  . Obstructive sleep apnea  . Hip pain

## 2011-11-19 NOTE — Patient Instructions (Addendum)
Your physician recommends that you schedule a follow-up appointment in: 1 year with Dr. Andee Lineman. You will receive a reminder letter in the mail in about 10 months reminding you to call and schedule your appointment. If you don't receive this letter, please contact our office.  Your physician recommends that you continue on your current medications as directed. Please refer to the Current Medication list given to you today.  Your physician recommends that you return for lab work in: TODAY AT [facility] FOR [facility].

## 2011-11-19 NOTE — Assessment & Plan Note (Addendum)
Blood pressure very well controlled after changing his medications in the last office visit the patient monitors his blood pressure occasionally in the emergency room. Patient does need followup laboratory work given the fact that he is on ACE inhibitors and does not have a primary care physician when he gets routine blood work. I urged him to try to identify a primary care physician as soon as possible

## 2011-11-20 ENCOUNTER — Other Ambulatory Visit: Payer: Self-pay | Admitting: *Deleted

## 2011-11-20 MED ORDER — AMLODIPINE BESYLATE 10 MG PO TABS: 10.0000 mg | ORAL_TABLET | Freq: Every day | ORAL | Status: DC

## 2011-12-11 ENCOUNTER — Other Ambulatory Visit: Payer: Self-pay | Admitting: *Deleted

## 2011-12-11 MED ORDER — LISINOPRIL 40 MG PO TABS: 40.0000 mg | ORAL_TABLET | Freq: Every day | ORAL | Status: DC

## 2012-09-12 ENCOUNTER — Other Ambulatory Visit: Payer: Self-pay | Admitting: Physician Assistant

## 2012-09-12 ENCOUNTER — Other Ambulatory Visit: Payer: Self-pay | Admitting: *Deleted

## 2012-09-12 MED ORDER — CARVEDILOL 6.25 MG PO TABS: 6.2500 mg | ORAL_TABLET | Freq: Two times a day (BID) | ORAL | Status: DC

## 2012-11-03 ENCOUNTER — Other Ambulatory Visit: Payer: Self-pay | Admitting: Cardiology

## 2012-11-03 MED ORDER — LISINOPRIL 40 MG PO TABS: 40.0000 mg | ORAL_TABLET | Freq: Every day | ORAL | Status: DC

## 2012-11-03 MED ORDER — AMLODIPINE BESYLATE 10 MG PO TABS: 10.0000 mg | ORAL_TABLET | Freq: Every day | ORAL | Status: DC

## 2012-11-03 MED ORDER — CHLORTHALIDONE 25 MG PO TABS: 25.0000 mg | ORAL_TABLET | Freq: Every day | ORAL | Status: DC

## 2012-11-03 NOTE — Telephone Encounter (Signed)
Patient walked in needing refills for lisinopril, chlorthiladone, amlodipine. Sent refills into [facility] qty of 30 with 1 refill. Took pts blood pressure and it was 150/89.

## 2012-12-09 ENCOUNTER — Ambulatory Visit (INDEPENDENT_AMBULATORY_CARE_PROVIDER_SITE_OTHER): Payer: Medicare Other | Admitting: Cardiovascular Disease

## 2012-12-09 VITALS — BP 106/69 | HR 78 | Ht 65.0 in | Wt 207.0 lb

## 2012-12-09 DIAGNOSIS — G4733 Obstructive sleep apnea (adult) (pediatric): Secondary | ICD-10-CM

## 2012-12-09 DIAGNOSIS — I10 Essential (primary) hypertension: Secondary | ICD-10-CM

## 2012-12-09 DIAGNOSIS — I429 Cardiomyopathy, unspecified: Secondary | ICD-10-CM

## 2012-12-09 NOTE — Patient Instructions (Signed)
   Lab for BMET - today  Office will contact with results via phone or letter.   Continue all current medications. Your physician wants you to follow up in:  1 year.  You will receive a reminder letter in the mail one-two months in advance.  If you don't receive a letter, please call our office to schedule the follow up appointment

## 2012-12-09 NOTE — Progress Notes (Signed)
Patient ID: [name] [name], male   DOB: 04-14-1955, 57 y.o.   MRN: [id number]   SUBJECTIVE: [name] [name] is a 57 year old male with a history of a nonischemic cardiomyopathy. He has been doing well. He has not required any hospitalizations for heart failure. He reports no orthopnea, PND, palpitations or syncope. He also has sleep apnea and used to use CPAP but not anymore. He reportedly has right ventricular enlargement by echocardiogram, with normal RV function.  He says he used to weigh 270 lbs, but now weighs 207 lbs. He avoids salt and takes his meds routinely.  He is apparently going to have hip replacement surgery at some point in the future.    Allergies  Allergen Reactions  . Penicillins     REACTION: rash    Current Outpatient Prescriptions  Medication Sig Dispense Refill  . amLODipine (NORVASC) 10 MG tablet Take 1 tablet (10 mg total) by mouth daily.  30 tablet  1  . aspirin 81 MG EC tablet Take 81 mg by mouth daily.        . carvedilol (COREG) 6.25 MG tablet Take 1 tablet (6.25 mg total) by mouth 2 (two) times daily.  60 tablet  3  . chlorthalidone (HYGROTON) 25 MG tablet Take 1 tablet (25 mg total) by mouth daily.  30 tablet  1  . lisinopril (PRINIVIL,ZESTRIL) 40 MG tablet Take 1 tablet (40 mg total) by mouth daily.  30 tablet  1   No current facility-administered medications for this visit.    Past Medical History  Diagnosis Date  . Morbid obesity   . HTN (hypertension)   . Chronic systolic heart failure   . Cardiomyopathy     secondary   . OSA (obstructive sleep apnea)     No past surgical history on file.  History   Social History  . Marital Status: Single    Spouse Name: N/A    Number of Children: N/A  . Years of Education: N/A   Occupational History  . Not on file.   Social History Main Topics  . Smoking status: Current Every Day Smoker -- 0.50 packs/day for 40 years    Types: Cigarettes  . Smokeless tobacco: Never Used  . Alcohol Use: No  .  Drug Use: Not on file  . Sexual Activity: Not on file   Other Topics Concern  . Not on file   Social History Narrative   Disabled.     @FAMX @  Filed Vitals:   12/09/12 0920  Height: 5\' 5"  (1.651 m)  Weight: 207 lb (93.895 kg)   BP: 106/69 mmHg HR: 78 bpm  PHYSICAL EXAM General: NAD Neck: No JVD, no thyromegaly or thyroid nodule.  Lungs: Clear to auscultation bilaterally with normal respiratory effort. CV: Nondisplaced PMI.  Heart regular S1/S2, no S3/S4, no murmur.  No peripheral edema.  No carotid bruit.  Normal pedal pulses.  Abdomen: Soft, nontender, no hepatosplenomegaly, no distention.  Neurologic: Alert and oriented x 3.  Psych: Normal affect. Extremities: No clubbing or cyanosis.   ECG: reviewed and available in electronic records.      ASSESSMENT AND PLAN: CARDIOMYOPATHY, SECONDARY  Patient is doing well. No heart failure exacerbations requiring admission to the ER or hospitalization. Patient has been compliant with his medications and diet. He has no orthopnea, PND, or shortness of breath. We will continue his current medications. No need for followup echocardiogram at this point.  HYPERTENSION, UNSPECIFIED  Blood pressure well controlled. Will check a BMP to  evaluate his renal function, as he does not currently have a PCP.  Obstructive sleep apnea  Does not use CPAP, but says he no longer snores much anymore since his weight loss. Will defer to PCP, as he is trying to get an appointment with one.  Prentice Docker, M.D., F.A.C.C.

## 2012-12-16 ENCOUNTER — Telehealth: Payer: Self-pay | Admitting: *Deleted

## 2012-12-16 DIAGNOSIS — I10 Essential (primary) hypertension: Secondary | ICD-10-CM

## 2012-12-16 DIAGNOSIS — Z79899 Other long term (current) drug therapy: Secondary | ICD-10-CM

## 2012-12-16 NOTE — Telephone Encounter (Signed)
Notes Recorded by Lesle Chris, LPN on 04/14/1094 at 11:22 AM Patient notified. Should patient resume after holding for the 7 days or wait until labs results are in?

## 2012-12-16 NOTE — Telephone Encounter (Signed)
Message copied by Lesle Chris on Tue Dec 16, 2012 11:23 AM ------      Message from: Prentice Docker A      Created: Fri Dec 12, 2012  6:28 PM       Markedly abnormal as compared to renal function one year ago. Hold Chlorthalidone for one week and recheck BMP. ------

## 2012-12-17 ENCOUNTER — Encounter: Payer: Self-pay | Admitting: *Deleted

## 2012-12-17 NOTE — Telephone Encounter (Signed)
Notes Recorded by Lesle Chris, LPN on 9/56/2130 at 2:12 PM Patient notified. Instructions will be mailed to patient also.  Notes Recorded by Laqueta Linden, MD on 12/16/2012 at 11:56 AM After 7 days, will plan to reduce Chlorthalidone to 12.5 mg daily, Lisinopril to 20 mg daily, and he should then have BP rechecked with nurse in 4 weeks. If he can monitor his BP at home in the meantime, that would be helpful.

## 2012-12-31 ENCOUNTER — Other Ambulatory Visit: Payer: Self-pay | Admitting: Physician Assistant

## 2013-01-08 ENCOUNTER — Ambulatory Visit (INDEPENDENT_AMBULATORY_CARE_PROVIDER_SITE_OTHER): Payer: Medicare Other | Admitting: *Deleted

## 2013-01-08 VITALS — BP 127/78 | HR 74

## 2013-01-08 DIAGNOSIS — I10 Essential (primary) hypertension: Secondary | ICD-10-CM

## 2013-01-08 MED ORDER — LISINOPRIL 40 MG PO TABS: 20.0000 mg | ORAL_TABLET | Freq: Every day | ORAL | Status: DC

## 2013-01-08 MED ORDER — CHLORTHALIDONE 25 MG PO TABS: 12.5000 mg | ORAL_TABLET | Freq: Every day | ORAL | Status: DC

## 2013-01-08 NOTE — Progress Notes (Signed)
Patient in office this morning for BP check.  He did make the recent med changes as discussed 12/17/2012.  Stated that he did stop his Diclofenac (Voltaren) though.  Stated he had a friend that was taking same med & he stopped because his MD told him it was hurting his kidneys.  Stated he will be doing his lab tomorrow.

## 2013-04-07 ENCOUNTER — Telehealth: Payer: Self-pay | Admitting: *Deleted

## 2013-04-07 NOTE — Telephone Encounter (Signed)
Message copied by Lesle Chris on Tue Apr 07, 2013  9:31 AM ------      Message from: Lesle Chris      Created: Wed Jan 14, 2013 10:09 AM      Regarding: RE: ...   koneswaran   ...       Was supposed to be going 01/13/2013            ----- Message -----         From: Lesle Chris, LPN         Sent: 01/14/2013           To: Lesle Chris, LPN      Subject: RE: ...   koneswaran   ...                               Doing 10/3            ----- Message -----         From: Lesle Chris, LPN         Sent: 12/29/2012           To: Lesle Chris, LPN      Subject: ...   koneswaran   ...                                   BMET - 9/18             ------

## 2013-04-07 NOTE — Telephone Encounter (Signed)
Discussed need for getting his follow up labs.  BMET is to follow up on his recent elevated kidney function.  Patient stated that he forgot & will go today.  Orders faxed to [facility].

## 2013-04-13 ENCOUNTER — Telehealth: Payer: Self-pay | Admitting: *Deleted

## 2013-04-13 NOTE — Telephone Encounter (Signed)
Message copied by Laurine Blazer on Mon Apr 13, 2013  1:16 PM ------      Message from: Kate Sable A      Created: Mon Apr 13, 2013 11:40 AM       Has ckd but improved since 12/2012. Will need routine f/u with a PCP. ------

## 2013-04-13 NOTE — Telephone Encounter (Signed)
Notes Recorded by Laurine Blazer, LPN on 07/15/5460 at 7:03 PM Patient notified. States he does have PMD now, has established with Dr. Manuella Ghazi. Will forward labs. States he has OV with Dr. Manuella Ghazi for 05/10/2013.

## 2013-04-13 NOTE — Telephone Encounter (Signed)
Done, see phone note dated 04/13/2013.

## 2014-01-18 ENCOUNTER — Other Ambulatory Visit: Payer: Self-pay | Admitting: Cardiovascular Disease

## 2014-01-29 ENCOUNTER — Ambulatory Visit (INDEPENDENT_AMBULATORY_CARE_PROVIDER_SITE_OTHER): Payer: Medicare Other | Admitting: Cardiovascular Disease

## 2014-01-29 ENCOUNTER — Encounter: Payer: Self-pay | Admitting: Cardiovascular Disease

## 2014-01-29 VITALS — BP 109/72 | HR 75 | Ht 65.0 in | Wt 219.0 lb

## 2014-01-29 DIAGNOSIS — I429 Cardiomyopathy, unspecified: Secondary | ICD-10-CM

## 2014-01-29 DIAGNOSIS — I428 Other cardiomyopathies: Secondary | ICD-10-CM

## 2014-01-29 DIAGNOSIS — I10 Essential (primary) hypertension: Secondary | ICD-10-CM

## 2014-01-29 DIAGNOSIS — Z01818 Encounter for other preprocedural examination: Secondary | ICD-10-CM

## 2014-01-29 NOTE — Addendum Note (Signed)
Addended by: Laurine Blazer on: 01/29/2014 02:40 PM   Modules accepted: Orders

## 2014-01-29 NOTE — Progress Notes (Signed)
Patient ID: [name] [name], male   DOB: 11-06-1955, 58 y.o.   MRN: [id number]      SUBJECTIVE: The patient is here to followup for a nonischemic cardiomyopathy and essential hypertension. The patient denies any symptoms of chest pain, palpitations, shortness of breath, lightheadedness, dizziness, leg swelling, orthopnea, PND, and syncope. ECG performed in the office today demonstrates sinus rhythm, heart rate 73 beats per minute, first degree AV block with PR interval 226 ms, and a diffuse nonspecific T wave abnormality. He would like to undergo bilateral hip replacement.  Review of Systems: As per "subjective", otherwise negative.  Allergies  Allergen Reactions  . Penicillins     REACTION: rash    Current Outpatient Prescriptions  Medication Sig Dispense Refill  . amLODipine (NORVASC) 10 MG tablet TAKE 1 TABLET BY MOUTH EVERY DAY  30 tablet  11  . aspirin 81 MG EC tablet Take 81 mg by mouth daily.        . carvedilol (COREG) 6.25 MG tablet Take 1 tablet (6.25 mg total) by mouth 2 (two) times daily.  60 tablet  3  . chlorthalidone (HYGROTON) 25 MG tablet Take 0.5 tablets (12.5 mg total) by mouth daily.      Marland Kitchen lisinopril (PRINIVIL,ZESTRIL) 40 MG tablet Take 0.5 tablets (20 mg total) by mouth daily.       No current facility-administered medications for this visit.    Past Medical History  Diagnosis Date  . Morbid obesity   . HTN (hypertension)   . Chronic systolic heart failure   . Cardiomyopathy     secondary   . OSA (obstructive sleep apnea)     No past surgical history on file.  History   Social History  . Marital Status: Single    Spouse Name: N/A    Number of Children: N/A  . Years of Education: N/A   Occupational History  . Not on file.   Social History Main Topics  . Smoking status: Current Every Day Smoker -- 0.50 packs/day for 40 years    Types: Cigarettes    Start date: 08/01/1974  . Smokeless tobacco: Never Used  . Alcohol Use: No  . Drug Use:  Not on file  . Sexual Activity: Not on file   Other Topics Concern  . Not on file   Social History Narrative   Disabled.      Filed Vitals:   01/29/14 1408  Height: 5\' 5"  (1.651 m)  Weight: 219 lb (99.338 kg)    PHYSICAL EXAM General: NAD HEENT: Normal. Neck: No JVD, no thyromegaly. Lungs: Clear to auscultation bilaterally with normal respiratory effort. CV: Nondisplaced PMI.  Regular rate and rhythm, normal S1/S2, no S3/S4, no murmur. No pretibial or periankle edema.  No carotid bruit. Abdomen: Soft, nontender, no hepatosplenomegaly, no distention.  Neurologic: Alert and oriented x 3.  Psych: Normal affect. Skin: Normal. Musculoskeletal: No gross deformities. Extremities: No clubbing or cyanosis.   ECG: Most recent ECG reviewed.      ASSESSMENT AND PLAN: 1. Nonischemic cardiomyopathy/chronic systolic heart failure: Symptomatically stable with no signs of heart failure. Continue carvedilol 6.25 mg twice daily along with lisinopril 40 mg daily. Given the fact that he would like to undergo major orthopedic surgery, will obtain an echocardiogram to evaluate left ventricular systolic and diastolic function. 2. Essential HTN: Well controlled on current therapy which includes carvedilol, chlorthalidone, and lisinopril. No changes.  Dispo: f/u 1 year.  Kate Sable, M.D., F.A.C.C.

## 2014-01-29 NOTE — Patient Instructions (Signed)
Your physician has requested that you have an echocardiogram. Echocardiography is a painless test that uses sound waves to create images of your heart. It provides your doctor with information about the size and shape of your heart and how well your heart's chambers and valves are working. This procedure takes approximately one hour. There are no restrictions for this procedure. Office will contact with results via phone or letter.   Your physician wants you to follow up in:  1 year.  You will receive a reminder letter in the mail one-two months in advance.  If you don't receive a letter, please call our office to schedule the follow up appointment

## 2014-02-10 ENCOUNTER — Other Ambulatory Visit: Payer: Self-pay

## 2014-02-10 ENCOUNTER — Other Ambulatory Visit (INDEPENDENT_AMBULATORY_CARE_PROVIDER_SITE_OTHER): Payer: Medicare Other

## 2014-02-10 DIAGNOSIS — Z01818 Encounter for other preprocedural examination: Secondary | ICD-10-CM

## 2014-02-10 DIAGNOSIS — I428 Other cardiomyopathies: Secondary | ICD-10-CM

## 2014-02-11 ENCOUNTER — Telehealth: Payer: Self-pay | Admitting: *Deleted

## 2014-02-11 NOTE — Telephone Encounter (Signed)
Notes Recorded by Laurine Blazer, LPN on 49/05/98 at 71:21 AM Patient notified and verbalized understanding. Will forward copy to Dr. Alphonzo Cruise ([facility] [place]) along with [place] notes from 01/29/2014.

## 2014-02-11 NOTE — Telephone Encounter (Signed)
-----   Message from Herminio Commons, MD sent at 02/10/2014  4:42 PM EST ----- Normal LV systolic function. Can proceed with surgery.

## 2014-06-23 ENCOUNTER — Encounter: Payer: Self-pay | Admitting: Orthopedic Surgery

## 2014-06-29 ENCOUNTER — Ambulatory Visit (INDEPENDENT_AMBULATORY_CARE_PROVIDER_SITE_OTHER): Payer: Medicare Other

## 2014-06-29 ENCOUNTER — Ambulatory Visit (INDEPENDENT_AMBULATORY_CARE_PROVIDER_SITE_OTHER): Payer: Medicare Other | Admitting: Orthopedic Surgery

## 2014-06-29 ENCOUNTER — Encounter: Payer: Self-pay | Admitting: Orthopedic Surgery

## 2014-06-29 VITALS — BP 109/66 | Ht 65.0 in | Wt 234.0 lb

## 2014-06-29 DIAGNOSIS — M25551 Pain in right hip: Secondary | ICD-10-CM

## 2014-06-29 DIAGNOSIS — M160 Bilateral primary osteoarthritis of hip: Secondary | ICD-10-CM

## 2014-06-29 DIAGNOSIS — M5410 Radiculopathy, site unspecified: Secondary | ICD-10-CM

## 2014-06-29 DIAGNOSIS — M129 Arthropathy, unspecified: Secondary | ICD-10-CM

## 2014-06-29 DIAGNOSIS — M25552 Pain in left hip: Secondary | ICD-10-CM

## 2014-06-29 NOTE — Patient Instructions (Signed)
We will refer you to Dr Ninfa Linden for hip replacements

## 2014-06-29 NOTE — Progress Notes (Signed)
Patient ID: [name] FORNWALT, male   DOB: [birth year], 59 y.o.   MRN: [id number] Patient ID: [name] [name], male   DOB: 11-04-1955, 59 y.o.   MRN: [id number]  Chief Complaint  Patient presents with  . Hip Pain    bilateral hip pain radiates down legs. REFER. [facility]   [name] [name] is a 59 y.o. male.   HPI This 59 year old male with a history of congestive heart failure and was unsuccessful colon cancer surgery presents with bilateral hip pain for the last 3 years. He says he is on disability because of his heart but he says he feels fine and his last checkup with his cardiologist said that he was doing well his ejection fraction is 55%  He complains of sharp pain along his iliac crest bilaterally which is constant and he rates it a 10 he has difficulty doing simple things such as walking, getting out of a chair, getting out of a car or into a car, climbing steps and getting off the commode. He uses a cane and at sometimes uses 2 canes. He has no history of any previous treatment related to his hip disease  He is allergic to penicillin  Family history diabetes and cancer  He smokes half pack a day does not drink disabled because of CHF  Review of systems he only complains of joint pain and limb pain and specifically denies shortness of breath with exertion  Medical history of hypertension heart disease and cancer.  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 Morbid obesity   . HTN (hypertension)   . Chronic systolic heart failure   . Cardiomyopathy     secondary   . OSA (obstructive sleep apnea)     History reviewed. No pertinent past surgical history.  History reviewed. No pertinent family history.  Social History History  Substance Use Topics  . Smoking status: Current Every Day Smoker -- 0.50 packs/day for 40 years    Types: Cigarettes    Start date: 08/01/1974  . Smokeless tobacco:  Never Used  . Alcohol Use: No    Allergies  Allergen Reactions  . Penicillins     REACTION: rash    Current Outpatient Prescriptions  Medication Sig Dispense Refill  . amLODipine (NORVASC) 10 MG tablet TAKE 1 TABLET BY MOUTH EVERY DAY 30 tablet 11  . aspirin 81 MG EC tablet Take 81 mg by mouth daily.      . carvedilol (COREG) 6.25 MG tablet Take 6.25 mg by mouth 2 (two) times daily.    . chlorthalidone (HYGROTON) 25 MG tablet Take 25 mg by mouth daily.    . diclofenac (VOLTAREN) 75 MG EC tablet Take 75 mg by mouth 2 (two) times daily.    Marland Kitchen lisinopril (PRINIVIL,ZESTRIL) 40 MG tablet Take 40 mg by mouth daily.    . traMADol (ULTRAM) 50 MG tablet Take 50 mg by mouth every 6 (six) hours as needed.     No current facility-administered medications for this visit.       Physical Exam Blood pressure 109/66, height 5\' 5"  (1.651 m), weight 234 lb (106.142 kg). Physical Exam The patient is well developed well nourished and well groomed. Orientation to person place and time is normal  Mood is pleasant. Ambulatory status he is walking with a cane. His appearance shows a large protuberant abdomen abdomen but otherwise normal he is oriented 3 his  mood and affect are pleasant he's walking with a cane he limps and has external foot progression angles bilaterally. As far as his upper extremities go inspection reveals no abnormalities. No contractures are noted on range of motion assessment. His joints are reduced without subluxation. Muscle tone normal. Skin normal. Upper extremity pulses are intact with no edema. And he has no lymphadenopathy and normal sensation  Both hips are disease severely with flexion he goes into severe external rotation he cannot rotate his leg to neutral in the supine position or flexed position he has painful range of motion in both hips although they remain stable muscle tone is normal bilaterally skin is intact good distal pulses normal sensation  Palpation of the  lumbar spine revealed no tenderness and straight leg raises were normal  Data Reviewed X-ray evaluation shows severely diseased hip joints with ankylosis on the left to the pelvis and near ankylosis on the right. He also has lumbar disc disease moderate.    Assessment Bilateral hip arthritis with lumbar spondylosis Plan He will need bilateral hip replacements but he is disabled because of congestive heart failure and will be sent to a surgeon who can do his surgery at [facility].

## 2014-06-30 ENCOUNTER — Telehealth: Payer: Self-pay | Admitting: *Deleted

## 2014-06-30 ENCOUNTER — Other Ambulatory Visit: Payer: Self-pay | Admitting: *Deleted

## 2014-06-30 DIAGNOSIS — M160 Bilateral primary osteoarthritis of hip: Secondary | ICD-10-CM

## 2014-06-30 NOTE — Telephone Encounter (Signed)
REFERRAL FAXED TO [facility] FOR DR BLACKMAN 

## 2014-07-08 NOTE — Telephone Encounter (Signed)
APPT 07/22/14 @ 2:15 W/ [employer]

## 2014-08-16 ENCOUNTER — Other Ambulatory Visit (HOSPITAL_COMMUNITY): Payer: Self-pay | Admitting: Orthopaedic Surgery

## 2014-08-19 ENCOUNTER — Other Ambulatory Visit (HOSPITAL_COMMUNITY): Payer: Self-pay | Admitting: Anesthesiology

## 2014-08-19 NOTE — Patient Instructions (Signed)
[name] [name]  08/19/2014   Your procedure is scheduled on: Friday 08/27/2014  Report to [facility] Main  Entrance and follow signs to               [place] at 1000 AM.  Call this number if you have problems the morning of surgery [phone]   Remember: ONLY 1 PERSON MAY GO WITH YOU TO SHORT STAY TO GET  READY MORNING OF [place].  Do not eat food or drink liquids :After Midnight.     Take these medicines the morning of surgery with A SIP OF WATER: Amlodipine,Carvedilol                               You may not have any metal on your body including hair pins and              piercings  Do not wear jewelry, make-up, lotions, powders or perfumes.             Do not wear nail polish.  Do not shave  48 hours prior to surgery.              Men may shave face and neck.   Do not bring valuables to the hospital. [place].  Contacts, dentures or bridgework may not be worn into surgery.  Leave suitcase in the car. After surgery it may be brought to your room.     Patients discharged the day of surgery will not be allowed to drive home.  Name and phone number of your driver:  Special Instructions: N/A              Please read over the following fact sheets you were given: _____________________________________________________________________             [facility] - Preparing for Surgery Before surgery, you can play an important role.  Because skin is not sterile, your skin needs to be as free of germs as possible.  You can reduce the number of germs on your skin by washing with CHG (chlorahexidine gluconate) soap before surgery.  CHG is an antiseptic cleaner which kills germs and bonds with the skin to continue killing germs even after washing. Please DO NOT use if you have an allergy to CHG or antibacterial soaps.  If your skin becomes reddened/irritated stop using the CHG and inform your nurse  when you arrive at Short Stay. Do not shave (including legs and underarms) for at least 48 hours prior to the first CHG shower.  You may shave your face/neck. Please follow these instructions carefully:  1.  Shower with CHG Soap the night before surgery and the  morning of Surgery.  2.  If you choose to wash your hair, wash your hair first as usual with your  normal  shampoo.  3.  After you shampoo, rinse your hair and body thoroughly to remove the  shampoo.                           4.  Use CHG as you would any other liquid soap.  You can apply chg directly  to the skin and wash  Gently with a scrungie or clean washcloth.  5.  Apply the CHG Soap to your body ONLY FROM THE NECK DOWN.   Do not use on face/ open                           Wound or open sores. Avoid contact with eyes, ears mouth and genitals (private parts).                       Wash face,  Genitals (private parts) with your normal soap.             6.  Wash thoroughly, paying special attention to the area where your surgery  will be performed.  7.  Thoroughly rinse your body with warm water from the neck down.  8.  DO NOT shower/wash with your normal soap after using and rinsing off  the CHG Soap.                9.  Pat yourself dry with a clean towel.            10.  Wear clean pajamas.            11.  Place clean sheets on your bed the night of your first shower and do not  sleep with pets. Day of Surgery : Do not apply any lotions/deodorants the morning of surgery.  Please wear clean clothes to the hospital/surgery center.  FAILURE TO FOLLOW THESE INSTRUCTIONS MAY RESULT IN THE CANCELLATION OF YOUR SURGERY PATIENT SIGNATURE_________________________________  NURSE SIGNATURE__________________________________  ________________________________________________________________________   Adam Phenix  An incentive spirometer is a tool that can help keep your lungs clear and active. This tool  measures how well you are filling your lungs with each breath. Taking long deep breaths may help reverse or decrease the chance of developing breathing (pulmonary) problems (especially infection) following:  A long period of time when you are unable to move or be active. BEFORE THE PROCEDURE   If the spirometer includes an indicator to show your best effort, your nurse or respiratory therapist will set it to a desired goal.  If possible, sit up straight or lean slightly forward. Try not to slouch.  Hold the incentive spirometer in an upright position. INSTRUCTIONS FOR USE   Sit on the edge of your bed if possible, or sit up as far as you can in bed or on a chair.  Hold the incentive spirometer in an upright position.  Breathe out normally.  Place the mouthpiece in your mouth and seal your lips tightly around it.  Breathe in slowly and as deeply as possible, raising the piston or the ball toward the top of the column.  Hold your breath for 3-5 seconds or for as long as possible. Allow the piston or ball to fall to the bottom of the column.  Remove the mouthpiece from your mouth and breathe out normally.  Rest for a few seconds and repeat Steps 1 through 7 at least 10 times every 1-2 hours when you are awake. Take your time and take a few normal breaths between deep breaths.  The spirometer may include an indicator to show your best effort. Use the indicator as a goal to work toward during each repetition.  After each set of 10 deep breaths, practice coughing to be sure your lungs are clear. If you have an incision (the cut made at the time of surgery),  support your incision when coughing by placing a pillow or rolled up towels firmly against it. Once you are able to get out of bed, walk around indoors and cough well. You may stop using the incentive spirometer when instructed by your caregiver.  RISKS AND COMPLICATIONS  Take your time so you do not get dizzy or light-headed.  If  you are in pain, you may need to take or ask for pain medication before doing incentive spirometry. It is harder to take a deep breath if you are having pain. AFTER USE  Rest and breathe slowly and easily.  It can be helpful to keep track of a log of your progress. Your caregiver can provide you with a simple table to help with this. If you are using the spirometer at home, follow these instructions: [place] IF:   You are having difficultly using the spirometer.  You have trouble using the spirometer as often as instructed.  Your pain medication is not giving enough relief while using the spirometer.  You develop fever of 100.5 F (38.1 C) or higher. SEEK IMMEDIATE MEDICAL CARE IF:   You cough up bloody sputum that had not been present before.  You develop fever of 102 F (38.9 C) or greater.  You develop worsening pain at or near the incision site. MAKE SURE YOU:   Understand these instructions.  Will watch your condition.  Will get help right away if you are not doing well or get worse. Document Released: 08/06/2006 Document Revised: 06/18/2011 Document Reviewed: 10/07/2006 Exit[employer] Patient Information 2014 ExitCare, [place].   ________________________________________________________________________  WHAT IS A BLOOD TRANSFUSION? Blood Transfusion Information  A transfusion is the replacement of blood or some of its parts. Blood is made up of multiple cells which provide different functions.  Red blood cells carry oxygen and are used for blood loss replacement.  White blood cells fight against infection.  Platelets control bleeding.  Plasma helps clot blood.  Other blood products are available for specialized needs, such as hemophilia or other clotting disorders. BEFORE THE TRANSFUSION  Who gives blood for transfusions?   Healthy volunteers who are fully evaluated to make sure their blood is safe. This is blood bank blood. Transfusion therapy is the  safest it has ever been in the practice of medicine. Before blood is taken from a donor, a complete history is taken to make sure that person has no history of diseases nor engages in risky social behavior (examples are intravenous drug use or sexual activity with multiple partners). The donor's travel history is screened to minimize risk of transmitting infections, such as malaria. The donated blood is tested for signs of infectious diseases, such as HIV and hepatitis. The blood is then tested to be sure it is compatible with you in order to minimize the chance of a transfusion reaction. If you or a relative donates blood, this is often done in anticipation of surgery and is not appropriate for emergency situations. It takes many days to process the donated blood. RISKS AND COMPLICATIONS Although transfusion therapy is very safe and saves many lives, the main dangers of transfusion include:   Getting an infectious disease.  Developing a transfusion reaction. This is an allergic reaction to something in the blood you were given. Every precaution is taken to prevent this. The decision to have a blood transfusion has been considered carefully by your caregiver before blood is given. Blood is not given unless the benefits outweigh the risks. AFTER THE TRANSFUSION  Right after receiving a blood transfusion, you will usually feel much better and more energetic. This is especially true if your red blood cells have gotten low (anemic). The transfusion raises the level of the red blood cells which carry oxygen, and this usually causes an energy increase.  The nurse administering the transfusion will monitor you carefully for complications. HOME CARE INSTRUCTIONS  No special instructions are needed after a transfusion. You may find your energy is better. Speak with your caregiver about any limitations on activity for underlying diseases you may have. SEEK MEDICAL CARE IF:   Your condition is not improving  after your transfusion.  You develop redness or irritation at the intravenous (IV) site. SEEK IMMEDIATE MEDICAL CARE IF:  Any of the following symptoms occur over the next 12 hours:  Shaking chills.  You have a temperature by mouth above 102 F (38.9 C), not controlled by medicine.  Chest, back, or muscle pain.  People around you feel you are not acting correctly or are confused.  Shortness of breath or difficulty breathing.  Dizziness and fainting.  You get a rash or develop hives.  You have a decrease in urine output.  Your urine turns a dark color or changes to pink, red, or brown. Any of the following symptoms occur over the next 10 days:  You have a temperature by mouth above 102 F (38.9 C), not controlled by medicine.  Shortness of breath.  Weakness after normal activity.  The white part of the eye turns yellow (jaundice).  You have a decrease in the amount of urine or are urinating less often.  Your urine turns a dark color or changes to pink, red, or brown. Document Released: 03/23/2000 Document Revised: 06/18/2011 Document Reviewed: 11/10/2007 [facility] Patient Information 2014 [place], [place].  _______________________________________________________________________

## 2014-08-23 ENCOUNTER — Encounter (HOSPITAL_COMMUNITY)
Admission: RE | Admit: 2014-08-23 | Discharge: 2014-08-23 | Disposition: A | Discharge status: Home / Self Care | Payer: Medicare Other | Source: Ambulatory Visit | Attending: Orthopaedic Surgery | Admitting: Orthopaedic Surgery

## 2014-08-23 ENCOUNTER — Encounter (HOSPITAL_COMMUNITY): Payer: Self-pay

## 2014-08-23 DIAGNOSIS — M1612 Unilateral primary osteoarthritis, left hip: Secondary | ICD-10-CM | POA: Diagnosis not present

## 2014-08-23 DIAGNOSIS — Z01812 Encounter for preprocedural laboratory examination: Secondary | ICD-10-CM | POA: Diagnosis not present

## 2014-08-23 HISTORY — DX: Malignant (primary) neoplasm, unspecified: C80.1

## 2014-08-23 HISTORY — DX: Heart failure, unspecified: I50.9

## 2014-08-23 HISTORY — DX: Atherosclerotic heart disease of native coronary artery without angina pectoris: I25.10

## 2014-08-23 HISTORY — DX: Unspecified osteoarthritis, unspecified site: M19.90

## 2014-08-23 LAB — BASIC METABOLIC PANEL
Anion gap: 9 (ref 5–15)
BUN: 29 mg/dL — ABNORMAL HIGH (ref 6–20)
CO2: 26 mmol/L (ref 22–32)
Calcium: 9.3 mg/dL (ref 8.9–10.3)
Chloride: 107 mmol/L (ref 101–111)
Creatinine, Ser: 1.38 mg/dL — ABNORMAL HIGH (ref 0.61–1.24)
GFR calc non Af Amer: 54 mL/min — ABNORMAL LOW (ref >60)
Glucose, Bld: 115 mg/dL — ABNORMAL HIGH (ref 65–99)
POTASSIUM: 4.4 mmol/L (ref 3.5–5.1)
Sodium: 142 mmol/L (ref 135–145)

## 2014-08-23 LAB — CBC
HCT: 44.4 % (ref 39.0–52.0)
Hemoglobin: 14.3 g/dL (ref 13.0–17.0)
MCH: 29.8 pg (ref 26.0–34.0)
MCHC: 32.2 g/dL (ref 30.0–36.0)
MCV: 92.5 fL (ref 78.0–100.0)
PLATELETS: 172 10*3/uL (ref 150–400)
RBC: 4.8 MIL/uL (ref 4.22–5.81)
RDW: 14.4 % (ref 11.5–15.5)
WBC: 7.4 10*3/uL (ref 4.0–10.5)

## 2014-08-23 LAB — SURGICAL PCR SCREEN
MRSA, PCR: INVALID — AB
STAPHYLOCOCCUS AUREUS: INVALID — AB

## 2014-08-23 LAB — APTT: aPTT: 36 seconds (ref 24–37)

## 2014-08-23 LAB — ABO/RH: ABO/RH(D): O POS

## 2014-08-23 LAB — PROTIME-INR
INR: 1.24 (ref 0.00–1.49)
Prothrombin Time: 15.7 seconds — ABNORMAL HIGH (ref 11.6–15.2)

## 2014-08-25 LAB — MRSA CULTURE

## 2014-08-27 ENCOUNTER — Encounter (HOSPITAL_COMMUNITY): Payer: Self-pay | Admitting: *Deleted

## 2014-08-27 ENCOUNTER — Inpatient Hospital Stay (HOSPITAL_COMMUNITY): Payer: Medicare Other | Admitting: Anesthesiology

## 2014-08-27 ENCOUNTER — Encounter (HOSPITAL_COMMUNITY)
Admission: RE | Disposition: A | Discharge status: Home Health | Payer: Self-pay | Source: Ambulatory Visit | Attending: Orthopaedic Surgery

## 2014-08-27 ENCOUNTER — Inpatient Hospital Stay (HOSPITAL_COMMUNITY)
Admission: RE | Admit: 2014-08-27 | Discharge: 2014-08-29 | DRG: 470 | Disposition: A | Discharge status: Home Health | Payer: Medicare Other | Source: Ambulatory Visit | Attending: Orthopaedic Surgery | Admitting: Orthopaedic Surgery

## 2014-08-27 ENCOUNTER — Inpatient Hospital Stay (HOSPITAL_COMMUNITY): Payer: Medicare Other

## 2014-08-27 DIAGNOSIS — I2510 Atherosclerotic heart disease of native coronary artery without angina pectoris: Secondary | ICD-10-CM | POA: Diagnosis present

## 2014-08-27 DIAGNOSIS — I428 Other cardiomyopathies: Secondary | ICD-10-CM | POA: Diagnosis present

## 2014-08-27 DIAGNOSIS — Z6838 Body mass index (BMI) 38.0-38.9, adult: Secondary | ICD-10-CM

## 2014-08-27 DIAGNOSIS — F17210 Nicotine dependence, cigarettes, uncomplicated: Secondary | ICD-10-CM | POA: Diagnosis present

## 2014-08-27 DIAGNOSIS — I10 Essential (primary) hypertension: Secondary | ICD-10-CM | POA: Diagnosis present

## 2014-08-27 DIAGNOSIS — G4733 Obstructive sleep apnea (adult) (pediatric): Secondary | ICD-10-CM | POA: Diagnosis present

## 2014-08-27 DIAGNOSIS — M1612 Unilateral primary osteoarthritis, left hip: Secondary | ICD-10-CM

## 2014-08-27 DIAGNOSIS — Z01812 Encounter for preprocedural laboratory examination: Secondary | ICD-10-CM

## 2014-08-27 DIAGNOSIS — M160 Bilateral primary osteoarthritis of hip: Secondary | ICD-10-CM | POA: Diagnosis present

## 2014-08-27 DIAGNOSIS — Z85038 Personal history of other malignant neoplasm of large intestine: Secondary | ICD-10-CM

## 2014-08-27 DIAGNOSIS — I5022 Chronic systolic (congestive) heart failure: Secondary | ICD-10-CM | POA: Diagnosis present

## 2014-08-27 DIAGNOSIS — M1990 Unspecified osteoarthritis, unspecified site: Secondary | ICD-10-CM

## 2014-08-27 DIAGNOSIS — M25552 Pain in left hip: Secondary | ICD-10-CM | POA: Diagnosis present

## 2014-08-27 DIAGNOSIS — Z96642 Presence of left artificial hip joint: Secondary | ICD-10-CM

## 2014-08-27 HISTORY — PX: TOTAL HIP ARTHROPLASTY: SHX124

## 2014-08-27 LAB — TYPE AND SCREEN
ABO/RH(D): O POS
Antibody Screen: NEGATIVE

## 2014-08-27 SURGERY — ARTHROPLASTY, HIP, TOTAL, ANTERIOR APPROACH
Anesthesia: General | Site: Hip | Laterality: Left

## 2014-08-27 MED ORDER — SODIUM CHLORIDE 0.9 % IV SOLN
10.0000 mg | INTRAVENOUS | Status: DC | PRN
  Administered 2014-08-27: 20 ug/min via INTRAVENOUS

## 2014-08-27 MED ORDER — GLYCOPYRROLATE 0.2 MG/ML IJ SOLN
INTRAMUSCULAR | Status: AC
  Filled 2014-08-27: qty 3

## 2014-08-27 MED ORDER — CARVEDILOL 6.25 MG PO TABS
6.2500 mg | ORAL_TABLET | Freq: Two times a day (BID) | ORAL | Status: DC
  Administered 2014-08-27 – 2014-08-29 (×4): 6.25 mg via ORAL
  Filled 2014-08-27 (×5): qty 1

## 2014-08-27 MED ORDER — MIDAZOLAM HCL 2 MG/2ML IJ SOLN
INTRAMUSCULAR | Status: AC
  Filled 2014-08-27: qty 2

## 2014-08-27 MED ORDER — ONDANSETRON HCL 4 MG/2ML IJ SOLN
INTRAMUSCULAR | Status: DC | PRN
  Administered 2014-08-27: 4 mg via INTRAVENOUS

## 2014-08-27 MED ORDER — DOCUSATE SODIUM 100 MG PO CAPS
100.0000 mg | ORAL_CAPSULE | Freq: Two times a day (BID) | ORAL | Status: DC
  Administered 2014-08-27 – 2014-08-29 (×4): 100 mg via ORAL

## 2014-08-27 MED ORDER — ACETAMINOPHEN 325 MG PO TABS: 650.0000 mg | ORAL_TABLET | Freq: Four times a day (QID) | ORAL | Status: DC | PRN

## 2014-08-27 MED ORDER — HYDROMORPHONE HCL 1 MG/ML IJ SOLN
0.2500 mg | INTRAMUSCULAR | Status: DC | PRN
  Administered 2014-08-27 (×3): 0.25 mg via INTRAVENOUS

## 2014-08-27 MED ORDER — FENTANYL CITRATE (PF) 100 MCG/2ML IJ SOLN
INTRAMUSCULAR | Status: DC | PRN
  Administered 2014-08-27: 100 ug via INTRAVENOUS

## 2014-08-27 MED ORDER — GLYCOPYRROLATE 0.2 MG/ML IJ SOLN
INTRAMUSCULAR | Status: DC | PRN
  Administered 2014-08-27: 0.6 mg via INTRAVENOUS

## 2014-08-27 MED ORDER — ALUM & MAG HYDROXIDE-SIMETH 200-200-20 MG/5ML PO SUSP: 30.0000 mL | ORAL | Status: DC | PRN

## 2014-08-27 MED ORDER — ZOLPIDEM TARTRATE 5 MG PO TABS: 5.0000 mg | ORAL_TABLET | Freq: Every evening | ORAL | Status: DC | PRN

## 2014-08-27 MED ORDER — PROPOFOL 10 MG/ML IV BOLUS
INTRAVENOUS | Status: AC
  Filled 2014-08-27: qty 20

## 2014-08-27 MED ORDER — CLINDAMYCIN PHOSPHATE 900 MG/50ML IV SOLN
INTRAVENOUS | Status: AC
  Filled 2014-08-27: qty 50

## 2014-08-27 MED ORDER — ONDANSETRON HCL 4 MG/2ML IJ SOLN: 4.0000 mg | Freq: Four times a day (QID) | INTRAMUSCULAR | Status: DC | PRN

## 2014-08-27 MED ORDER — CLINDAMYCIN PHOSPHATE 600 MG/50ML IV SOLN
600.0000 mg | Freq: Four times a day (QID) | INTRAVENOUS | Status: AC
  Administered 2014-08-27 – 2014-08-28 (×2): 600 mg via INTRAVENOUS
  Filled 2014-08-27 (×2): qty 50

## 2014-08-27 MED ORDER — METOCLOPRAMIDE HCL 5 MG/ML IJ SOLN: 5.0000 mg | Freq: Three times a day (TID) | INTRAMUSCULAR | Status: DC | PRN

## 2014-08-27 MED ORDER — LIDOCAINE HCL (CARDIAC) 20 MG/ML IV SOLN
INTRAVENOUS | Status: DC | PRN
  Administered 2014-08-27: 50 mg via INTRAVENOUS

## 2014-08-27 MED ORDER — HYDROMORPHONE HCL 1 MG/ML IJ SOLN
INTRAMUSCULAR | Status: AC
  Filled 2014-08-27: qty 1

## 2014-08-27 MED ORDER — PROMETHAZINE HCL 25 MG/ML IJ SOLN: 6.2500 mg | INTRAMUSCULAR | Status: DC | PRN

## 2014-08-27 MED ORDER — HYDROMORPHONE HCL 2 MG/ML IJ SOLN
INTRAMUSCULAR | Status: AC
  Filled 2014-08-27: qty 1

## 2014-08-27 MED ORDER — PHENYLEPHRINE 40 MCG/ML (10ML) SYRINGE FOR IV PUSH (FOR BLOOD PRESSURE SUPPORT)
PREFILLED_SYRINGE | INTRAVENOUS | Status: AC
  Filled 2014-08-27: qty 10

## 2014-08-27 MED ORDER — METOCLOPRAMIDE HCL 10 MG PO TABS: 5.0000 mg | ORAL_TABLET | Freq: Three times a day (TID) | ORAL | Status: DC | PRN

## 2014-08-27 MED ORDER — ROCURONIUM BROMIDE 100 MG/10ML IV SOLN
INTRAVENOUS | Status: DC | PRN
  Administered 2014-08-27: 10 mg via INTRAVENOUS
  Administered 2014-08-27: 30 mg via INTRAVENOUS
  Administered 2014-08-27: 10 mg via INTRAVENOUS

## 2014-08-27 MED ORDER — LACTATED RINGERS IV SOLN
INTRAVENOUS | Status: DC | PRN
  Administered 2014-08-27 (×2): via INTRAVENOUS

## 2014-08-27 MED ORDER — ONDANSETRON HCL 4 MG/2ML IJ SOLN
INTRAMUSCULAR | Status: AC
  Filled 2014-08-27: qty 2

## 2014-08-27 MED ORDER — FENTANYL CITRATE (PF) 100 MCG/2ML IJ SOLN
INTRAMUSCULAR | Status: AC
  Filled 2014-08-27: qty 2

## 2014-08-27 MED ORDER — METHOCARBAMOL 1000 MG/10ML IJ SOLN
500.0000 mg | Freq: Four times a day (QID) | INTRAVENOUS | Status: DC | PRN
  Administered 2014-08-27: 500 mg via INTRAVENOUS
  Filled 2014-08-27 (×2): qty 5

## 2014-08-27 MED ORDER — PROPOFOL 10 MG/ML IV BOLUS
INTRAVENOUS | Status: DC | PRN
  Administered 2014-08-27: 200 mg via INTRAVENOUS

## 2014-08-27 MED ORDER — SODIUM CHLORIDE 0.9 % IV SOLN
INTRAVENOUS | Status: DC
  Administered 2014-08-27: 17:00:00 via INTRAVENOUS

## 2014-08-27 MED ORDER — CLINDAMYCIN PHOSPHATE 900 MG/50ML IV SOLN
900.0000 mg | INTRAVENOUS | Status: AC
  Administered 2014-08-27: 900 mg via INTRAVENOUS

## 2014-08-27 MED ORDER — AMLODIPINE BESYLATE 10 MG PO TABS
10.0000 mg | ORAL_TABLET | Freq: Every day | ORAL | Status: DC
Start: 2014-08-28 — End: 2014-08-29
  Filled 2014-08-27 (×2): qty 1

## 2014-08-27 MED ORDER — MIDAZOLAM HCL 5 MG/5ML IJ SOLN
INTRAMUSCULAR | Status: DC | PRN
  Administered 2014-08-27: 2 mg via INTRAVENOUS

## 2014-08-27 MED ORDER — DEXAMETHASONE SODIUM PHOSPHATE 10 MG/ML IJ SOLN
INTRAMUSCULAR | Status: DC | PRN
  Administered 2014-08-27: 10 mg via INTRAVENOUS

## 2014-08-27 MED ORDER — HYDROMORPHONE HCL 1 MG/ML IJ SOLN: 1.0000 mg | INTRAMUSCULAR | Status: DC | PRN

## 2014-08-27 MED ORDER — ASPIRIN EC 325 MG PO TBEC
325.0000 mg | DELAYED_RELEASE_TABLET | Freq: Two times a day (BID) | ORAL | Status: DC
  Administered 2014-08-27 – 2014-08-29 (×4): 325 mg via ORAL
  Filled 2014-08-27 (×6): qty 1

## 2014-08-27 MED ORDER — NEOSTIGMINE METHYLSULFATE 10 MG/10ML IV SOLN
INTRAVENOUS | Status: AC
  Filled 2014-08-27: qty 1

## 2014-08-27 MED ORDER — DIPHENHYDRAMINE HCL 12.5 MG/5ML PO ELIX: 12.5000 mg | ORAL_SOLUTION | ORAL | Status: DC | PRN

## 2014-08-27 MED ORDER — NEOSTIGMINE METHYLSULFATE 10 MG/10ML IV SOLN
INTRAVENOUS | Status: DC | PRN
  Administered 2014-08-27: 5 mg via INTRAVENOUS

## 2014-08-27 MED ORDER — PHENYLEPHRINE HCL 10 MG/ML IJ SOLN
INTRAMUSCULAR | Status: DC | PRN
  Administered 2014-08-27: 40 ug via INTRAVENOUS
  Administered 2014-08-27 (×2): 80 ug via INTRAVENOUS

## 2014-08-27 MED ORDER — ACETAMINOPHEN 650 MG RE SUPP: 650.0000 mg | Freq: Four times a day (QID) | RECTAL | Status: DC | PRN

## 2014-08-27 MED ORDER — MENTHOL 3 MG MT LOZG: 1.0000 | LOZENGE | OROMUCOSAL | Status: DC | PRN

## 2014-08-27 MED ORDER — ONDANSETRON HCL 4 MG PO TABS: 4.0000 mg | ORAL_TABLET | Freq: Four times a day (QID) | ORAL | Status: DC | PRN

## 2014-08-27 MED ORDER — PHENOL 1.4 % MT LIQD
1.0000 | OROMUCOSAL | Status: DC | PRN
  Filled 2014-08-27: qty 177

## 2014-08-27 MED ORDER — DEXAMETHASONE SODIUM PHOSPHATE 10 MG/ML IJ SOLN
INTRAMUSCULAR | Status: AC
  Filled 2014-08-27: qty 1

## 2014-08-27 MED ORDER — SUCCINYLCHOLINE CHLORIDE 20 MG/ML IJ SOLN
INTRAMUSCULAR | Status: DC | PRN
  Administered 2014-08-27: 100 mg via INTRAVENOUS

## 2014-08-27 MED ORDER — OXYCODONE HCL 5 MG PO TABS
5.0000 mg | ORAL_TABLET | ORAL | Status: DC | PRN
  Administered 2014-08-27: 5 mg via ORAL
  Administered 2014-08-27: 10 mg via ORAL
  Administered 2014-08-28 (×2): 5 mg via ORAL
  Administered 2014-08-28 – 2014-08-29 (×6): 10 mg via ORAL
  Filled 2014-08-27 (×5): qty 2
  Filled 2014-08-27 (×2): qty 1
  Filled 2014-08-27 (×3): qty 2

## 2014-08-27 MED ORDER — CHLORTHALIDONE 25 MG PO TABS
25.0000 mg | ORAL_TABLET | Freq: Every morning | ORAL | Status: DC
  Filled 2014-08-27 (×2): qty 1

## 2014-08-27 MED ORDER — METHOCARBAMOL 500 MG PO TABS
500.0000 mg | ORAL_TABLET | Freq: Four times a day (QID) | ORAL | Status: DC | PRN
  Administered 2014-08-28 – 2014-08-29 (×3): 500 mg via ORAL
  Filled 2014-08-27 (×4): qty 1

## 2014-08-27 SURGICAL SUPPLY — 41 items
BAG ZIPLOCK 12X15 (MISCELLANEOUS) IMPLANT
BENZOIN TINCTURE PRP APPL 2/3 (GAUZE/BANDAGES/DRESSINGS) IMPLANT
BLADE SAW SGTL 18X1.27X75 (BLADE) ×2 IMPLANT
BLADE SAW SGTL 18X1.27X75MM (BLADE) ×1
CAPT HIP TOTAL 2 ×3 IMPLANT
CELLS DAT CNTRL 66122 CELL SVR (MISCELLANEOUS) ×1 IMPLANT
CLOSURE WOUND 1/2 X4 (GAUZE/BANDAGES/DRESSINGS)
COVER PERINEAL POST (MISCELLANEOUS) ×3 IMPLANT
DRAPE C-ARM 42X120 X-RAY (DRAPES) ×3 IMPLANT
DRAPE STERI IOBAN 125X83 (DRAPES) ×3 IMPLANT
DRAPE U-SHAPE 47X51 STRL (DRAPES) ×9 IMPLANT
DRSG AQUACEL AG ADV 3.5X10 (GAUZE/BANDAGES/DRESSINGS) ×3 IMPLANT
DURAPREP 26ML APPLICATOR (WOUND CARE) ×3 IMPLANT
ELECT BLADE TIP CTD 4 INCH (ELECTRODE) ×3 IMPLANT
ELECT REM PT RETURN 9FT ADLT (ELECTROSURGICAL) ×3
ELECTRODE REM PT RTRN 9FT ADLT (ELECTROSURGICAL) ×1 IMPLANT
FACESHIELD WRAPAROUND (MASK) ×12 IMPLANT
GAUZE XEROFORM 1X8 LF (GAUZE/BANDAGES/DRESSINGS) ×3 IMPLANT
GLOVE BIO SURGEON STRL SZ7.5 (GLOVE) ×6 IMPLANT
GLOVE BIOGEL PI IND STRL 8 (GLOVE) ×2 IMPLANT
GLOVE BIOGEL PI INDICATOR 8 (GLOVE) ×4
GLOVE ECLIPSE 8.0 STRL XLNG CF (GLOVE) ×3 IMPLANT
GOWN STRL REUS W/TWL XL LVL3 (GOWN DISPOSABLE) ×6 IMPLANT
HANDPIECE INTERPULSE COAX TIP (DISPOSABLE) ×2
KIT BASIN OR (CUSTOM PROCEDURE TRAY) ×3 IMPLANT
PACK TOTAL JOINT (CUSTOM PROCEDURE TRAY) ×3 IMPLANT
PEN SKIN MARKING BROAD (MISCELLANEOUS) ×3 IMPLANT
RTRCTR WOUND ALEXIS 18CM MED (MISCELLANEOUS) ×3
SET HNDPC FAN SPRY TIP SCT (DISPOSABLE) ×1 IMPLANT
STAPLER VISISTAT 35W (STAPLE) ×3 IMPLANT
STRIP CLOSURE SKIN 1/2X4 (GAUZE/BANDAGES/DRESSINGS) IMPLANT
SUT ETHIBOND NAB CT1 #1 30IN (SUTURE) ×3 IMPLANT
SUT MNCRL AB 4-0 PS2 18 (SUTURE) IMPLANT
SUT VIC AB 0 CT1 36 (SUTURE) ×3 IMPLANT
SUT VIC AB 1 CT1 36 (SUTURE) ×3 IMPLANT
SUT VIC AB 2-0 CT1 27 (SUTURE) ×4
SUT VIC AB 2-0 CT1 TAPERPNT 27 (SUTURE) ×2 IMPLANT
TOWEL OR 17X26 10 PK STRL BLUE (TOWEL DISPOSABLE) ×3 IMPLANT
TOWEL OR NON WOVEN STRL DISP B (DISPOSABLE) ×3 IMPLANT
TRAY FOLEY W/METER SILVER 14FR (SET/KITS/TRAYS/PACK) IMPLANT
YANKAUER SUCT BULB TIP 10FT TU (MISCELLANEOUS) ×3 IMPLANT

## 2014-08-27 NOTE — Brief Op Note (Signed)
08/27/2014  1:40 PM  PATIENT:  [name] [name]  59 y.o. male  PRE-OPERATIVE DIAGNOSIS:  primary osteoarthritis left hip  POST-OPERATIVE DIAGNOSIS:  primary osteoarthritis left hip  PROCEDURE:  Procedure(s): LEFT TOTAL HIP ARTHROPLASTY ANTERIOR APPROACH (Left)  SURGEON:  Surgeon(s) and Role:    * [name], MD - Primary  PHYSICIAN ASSISTANT: Benita Stabile, PA-C  ANESTHESIA:   general  EBL:  Total I/O In: 1000 [I.V.:1000] Out: 400 [Blood:400]  BLOOD ADMINISTERED:none  DRAINS: none   LOCAL MEDICATIONS USED:  NONE  SPECIMEN:  No Specimen  DISPOSITION OF SPECIMEN:  N/A  COUNTS:  YES  TOURNIQUET:  * No tourniquets in log *  DICTATION: .Other Dictation: Dictation Number [id number]  PLAN OF CARE: Admit to inpatient   PATIENT DISPOSITION:  PACU - hemodynamically stable.   Delay start of Pharmacological VTE agent (>24hrs) due to surgical blood loss or risk of bleeding: no

## 2014-08-27 NOTE — H&P (Signed)
TOTAL HIP ADMISSION H&P  Patient is admitted for left total hip arthroplasty.  Subjective:  Chief Complaint: left hip pain  HPI: [name] [name], 59 y.o. male, has a history of pain and functional disability in the left hip(s) due to arthritis and patient has failed non-surgical conservative treatments for greater than 12 weeks to include NSAID's and/or analgesics, flexibility and strengthening excercises, use of assistive devices, weight reduction as appropriate and activity modification.  Onset of symptoms was gradual starting 5 years ago with gradually worsening course since that time.The patient noted no past surgery on the left hip(s).  Patient currently rates pain in the left hip at 10 out of 10 with activity. Patient has night pain, worsening of pain with activity and weight bearing, trendelenberg gait, pain that interfers with activities of daily living, pain with passive range of motion and crepitus. Patient has evidence of subchondral cysts, subchondral sclerosis, periarticular osteophytes and joint space narrowing by imaging studies. This condition presents safety issues increasing the risk of falls.  There is no current active infection.  Patient Active Problem List   Diagnosis Date Noted  . Osteoarthritis of left hip 08/27/2014  . Hip pain 11/19/2011  . Obstructive sleep apnea 11/22/2010  . OBESITY-MORBID (>100') 12/16/2008  . HYPERTENSION, UNSPECIFIED 12/16/2008  . 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 Morbid obesity   . HTN (hypertension)   . Chronic systolic heart failure   . Cardiomyopathy     secondary   . OSA (obstructive sleep apnea)   . Coronary artery disease   . CHF (congestive heart failure)   . Arthritis   . Cancer 2007    colon cancer    Past Surgical History  Procedure Laterality Date  . Colon surgery  2007    surgery for colon cancer    No prescriptions prior to admission   Allergies  Allergen Reactions   . Penicillins     REACTION: rash    History  Substance Use Topics  . Smoking status: Current Every Day Smoker -- 0.50 packs/day for 40 years    Types: Cigarettes    Start date: 08/01/1974  . Smokeless tobacco: Never Used  . Alcohol Use: No    No family history on file.   Review of Systems  Musculoskeletal: Positive for back pain and joint pain.  All other systems reviewed and are negative.   Objective:  Physical Exam  Constitutional: He is oriented to person, place, and time. He appears well-developed and well-nourished.  HENT:  Head: Normocephalic and atraumatic.  Eyes: EOM are normal. Pupils are equal, round, and reactive to light.  Neck: Normal range of motion. Neck supple.  Cardiovascular: Normal rate and regular rhythm.   Respiratory: Effort normal and breath sounds normal.  GI: Soft. Bowel sounds are normal.  Musculoskeletal:       Left hip: He exhibits decreased range of motion, decreased strength, tenderness, bony tenderness and crepitus.  Neurological: He is alert and oriented to person, place, and time.  Skin: Skin is warm and dry.  Psychiatric: He has a normal mood and affect.    Vital signs in last 24 hours:    Labs:   Estimated body mass index is 38.94 kg/(m^2) as calculated from the following:   Height as of 06/29/14: 5\' 5"  (1.651 m).   Weight as of 06/29/14: 106.142 kg (234 lb).   Imaging Review Plain radiographs demonstrate severe degenerative joint disease of the left hip(s). The bone  quality appears to be good for age and reported activity level.  Assessment/Plan:  End stage arthritis, left hip(s)  The patient history, physical examination, clinical judgement of the provider and imaging studies are consistent with end stage degenerative joint disease of the left hip(s) and total hip arthroplasty is deemed medically necessary. The treatment options including medical management, injection therapy, arthroscopy and arthroplasty were discussed at  length. The risks and benefits of total hip arthroplasty were presented and reviewed. The risks due to aseptic loosening, infection, stiffness, dislocation/subluxation,  thromboembolic complications and other imponderables were discussed.  The patient acknowledged the explanation, agreed to proceed with the plan and consent was signed. Patient is being admitted for inpatient treatment for surgery, pain control, PT, OT, prophylactic antibiotics, VTE prophylaxis, progressive ambulation and ADL's and discharge planning.The patient is planning to be discharged home with home health services

## 2014-08-27 NOTE — Anesthesia Postprocedure Evaluation (Signed)
  Anesthesia Post-op Note  Patient: [name] [name]  Procedure(s) Performed: Procedure(s): LEFT TOTAL HIP ARTHROPLASTY ANTERIOR APPROACH (Left)  Patient Location: PACU  Anesthesia Type:General  Level of Consciousness: awake and alert   Airway and Oxygen Therapy: Patient Spontanous Breathing  Post-op Pain: mild  Post-op Assessment: Post-op Vital signs reviewed  Post-op Vital Signs: stable  Last Vitals:  Filed Vitals:   08/27/14 1544  BP: 100/55  Pulse: 65  Temp: 36.9 C  Resp: 18    Complications: No apparent anesthesia complications

## 2014-08-27 NOTE — Anesthesia Procedure Notes (Signed)
Procedure Name: Intubation Date/Time: 08/27/2014 11:42 AM Performed by: Anne Fu Pre-anesthesia Checklist: Patient identified, Emergency Drugs available, Suction available, Patient being monitored and Timeout performed Patient Re-evaluated:Patient Re-evaluated prior to inductionOxygen Delivery Method: Circle system utilized Preoxygenation: Pre-oxygenation with 100% oxygen Intubation Type: IV induction Ventilation: Mask ventilation without difficulty Laryngoscope Size: Mac and 4 Grade View: Grade III Tube type: Oral Tube size: 7.5 mm Number of attempts: 1 Airway Equipment and Method: Bougie stylet Placement Confirmation: ETT inserted through vocal cords under direct vision,  positive ETCO2,  CO2 detector and breath sounds checked- equal and bilateral Secured at: 21 cm Tube secured with: Tape Dental Injury: Teeth and Oropharynx as per pre-operative assessment

## 2014-08-27 NOTE — Anesthesia Preprocedure Evaluation (Addendum)
Anesthesia Evaluation  Patient identified by MRN, date of birth, ID band Patient awake    Reviewed: Allergy & Precautions, NPO status   History of Anesthesia Complications Negative for: history of anesthetic complications  Airway Mallampati: I  TM Distance: >3 FB Neck ROM: Full    Dental  (+) Poor Dentition   Pulmonary sleep apnea , Current Smoker,  breath sounds clear to auscultation        Cardiovascular hypertension, +CHF Rhythm:Regular Rate:Normal  ECHO noted from 2015, preserved LV function   Neuro/Psych    GI/Hepatic   Endo/Other  Morbid obesity  Renal/GU Renal InsufficiencyRenal disease     Musculoskeletal   Abdominal (+) + obese,   Peds  Hematology   Anesthesia Other Findings   Reproductive/Obstetrics                            Anesthesia Physical Anesthesia Plan  ASA: III  Anesthesia Plan: General   Post-op Pain Management:    Induction: Intravenous  Airway Management Planned: Oral ETT  Additional Equipment:   Intra-op Plan:   Post-operative Plan: Extubation in OR  Informed Consent: I have reviewed the patients History and Physical, chart, labs and discussed the procedure including the risks, benefits and alternatives for the proposed anesthesia with the patient or authorized representative who has indicated his/her understanding and acceptance.     Plan Discussed with: CRNA and Surgeon  Anesthesia Plan Comments:        Anesthesia Quick Evaluation

## 2014-08-27 NOTE — Transfer of Care (Signed)
Immediate Anesthesia Transfer of Care Note  Patient: [name] [name]  Procedure(s) Performed: Procedure(s): LEFT TOTAL HIP ARTHROPLASTY ANTERIOR APPROACH (Left)  Patient Location: PACU  Anesthesia Type:General  Level of Consciousness: sedated  Airway & Oxygen Therapy: Patient Spontanous Breathing and Patient connected to face mask oxygen  Post-op Assessment: Report given to RN and Post -op Vital signs reviewed and stable  Post vital signs: Reviewed and stable  Last Vitals:  Filed Vitals:   08/27/14 0925  BP: 124/75  Pulse: 67  Temp: 36.6 C  Resp: 16    Complications: No apparent anesthesia complications

## 2014-08-28 LAB — BASIC METABOLIC PANEL
ANION GAP: 7 (ref 5–15)
BUN: 29 mg/dL — ABNORMAL HIGH (ref 6–20)
CHLORIDE: 102 mmol/L (ref 101–111)
CO2: 26 mmol/L (ref 22–32)
CREATININE: 1.71 mg/dL — AB (ref 0.61–1.24)
Calcium: 8 mg/dL — ABNORMAL LOW (ref 8.9–10.3)
GFR calc Af Amer: 48 mL/min — ABNORMAL LOW (ref >60)
GFR calc non Af Amer: 41 mL/min — ABNORMAL LOW (ref >60)
Glucose, Bld: 152 mg/dL — ABNORMAL HIGH (ref 65–99)
Potassium: 4.8 mmol/L (ref 3.5–5.1)
Sodium: 135 mmol/L (ref 135–145)

## 2014-08-28 LAB — CBC
HEMATOCRIT: 34.4 % — AB (ref 39.0–52.0)
Hemoglobin: 10.9 g/dL — ABNORMAL LOW (ref 13.0–17.0)
MCH: 29.4 pg (ref 26.0–34.0)
MCHC: 31.7 g/dL (ref 30.0–36.0)
MCV: 92.7 fL (ref 78.0–100.0)
Platelets: 174 10*3/uL (ref 150–400)
RBC: 3.71 MIL/uL — AB (ref 4.22–5.81)
RDW: 14 % (ref 11.5–15.5)
WBC: 11.8 10*3/uL — ABNORMAL HIGH (ref 4.0–10.5)

## 2014-08-28 MED ORDER — NICOTINE 14 MG/24HR TD PT24
14.0000 mg | MEDICATED_PATCH | Freq: Every day | TRANSDERMAL | Status: DC
  Administered 2014-08-28 – 2014-08-29 (×2): 14 mg via TRANSDERMAL
  Filled 2014-08-28 (×3): qty 1

## 2014-08-28 NOTE — Evaluation (Signed)
Occupational Therapy Evaluation Patient Details Name: [name] [name] MRN: [id number] DOB: 04-Aug-1955 Today's Date: 08/28/2014    History of Present Illness Pt admitted for direct anterior THA Lt LE    Clinical Impression   .Patient evaluated by Occupational Therapy with no further acute OT needs identified. All education has been completed and the patient has no further questions. Pt is able to perform all ADLs at min guard assist - min A level.   All education completed.  He should quickly return to modified independence.  See below for any follow-up Occupational Therapy or equipment needs. OT is signing off. Thank you for this referral.      Follow Up Recommendations  No OT follow up;Supervision - Intermittent    Equipment Recommendations  None recommended by OT    Recommendations for Other Services       Precautions / Restrictions Precautions Precautions: None Restrictions Weight Bearing Restrictions: Yes LLE Weight Bearing: Weight bearing as tolerated      Mobility Bed Mobility                  Transfers Overall transfer level: Needs assistance   Transfers: Sit to/from Stand;Stand Pivot Transfers Sit to Stand: Min guard Stand pivot transfers: Min guard            Balance                                            ADL Overall ADL's : Needs assistance/impaired Eating/Feeding: Independent   Grooming: Wash/dry hands;Wash/dry face;Oral care;Brushing hair;Min guard;Standing   Upper Body Bathing: Set up;Sitting   Lower Body Bathing: Minimal assistance;Sit to/from stand Lower Body Bathing Details (indicate cue type and reason): Pt instructed in use of AE Upper Body Dressing : Set up;Sitting   Lower Body Dressing: Minimal assistance;Sit to/from stand Lower Body Dressing Details (indicate cue type and reason): Pt instructed in use and acquistion of AE.  Friend can also assist as needed  Toilet Transfer: Min  guard;Ambulation;Regular Toilet;Grab bars;RW   Toileting- Water quality scientist and Hygiene: Min guard;Sit to/from stand   Tub/ Shower Transfer: Min guard;Ambulation;Grab bars;Rolling walker Tub/Shower Transfer Details (indicate cue type and reason): Pt able to simulate stepping over the tub, and simulated shower in standing with min guard assist  Functional mobility during ADLs: Min guard;Rolling walker General ADL Comments: Pt demonstrates good safety awareness and good problem solving skills.  Friend will assist him as needed      Vision     Perception     Praxis      Pertinent Vitals/Pain Pain Assessment: 0-10 Pain Score: 4  Pain Location: Lt hip  Pain Descriptors / Indicators: Aching;Cramping;Spasm Pain Intervention(s): Monitored during session;Ice applied     Hand Dominance     Extremity/Trunk Assessment Upper Extremity Assessment Upper Extremity Assessment: Overall WFL for tasks assessed   Lower Extremity Assessment Lower Extremity Assessment: Defer to PT evaluation   Cervical / Trunk Assessment Cervical / Trunk Assessment: Normal   Communication Communication Communication: No difficulties   Cognition Arousal/Alertness: Awake/alert Behavior During Therapy: WFL for tasks assessed/performed Overall Cognitive Status: Within Functional Limits for tasks assessed                     General Comments       Exercises       Shoulder Instructions  Home Living Family/patient expects to be discharged to:: Private residence Living Arrangements: Alone Available Help at Discharge: Friend(s);Available PRN/intermittently Type of Home: Apartment Home Access: Stairs to enter Entrance Stairs-Number of Steps: 2   Home Layout: One level     Bathroom Shower/Tub: Tub/shower unit;Curtain Shower/tub characteristics: Architectural technologist: Standard     Home Equipment: Cane - quad;Grab bars - tub/shower;Grab bars - toilet          Prior  Functioning/Environment Level of Independence: Independent with assistive device(s)             OT Diagnosis: Generalized weakness;Acute pain   OT Problem List:     OT Treatment/Interventions:      OT Goals(Current goals can be found in the care plan section) Acute Rehab OT Goals OT Goal Formulation: All assessment and education complete, DC therapy  OT Frequency:     Barriers to D/C:            Co-evaluation              End of Session Equipment Utilized During Treatment: Rolling walker Nurse Communication: Mobility status  Activity Tolerance: Patient tolerated treatment well Patient left: in chair;with call bell/phone within reach;with family/visitor present   Time: 2549-8264 OT Time Calculation (min): 25 min Charges:  OT General Charges $OT Visit: 1 Procedure OT Evaluation $Initial OT Evaluation Tier I: 1 Procedure OT Treatments $Self Care/Home Management : 8-22 mins G-Codes:    Jahiem Franzoni M 09-14-14, 9:58 AM

## 2014-08-28 NOTE — Progress Notes (Signed)
Physical Therapy Treatment Patient Details Name: [name] [name] MRN: [id number] DOB: 03/22/1956 Today's Date: 08/28/2014    History of Present Illness Pt admitted for direct anterior THA Lt LE     PT Comments    Pt very motivated and progressing well with mobility.  Pt hopeful for dc tomorrow.  Follow Up Recommendations  Home health PT     Equipment Recommendations  Rolling walker with 5" wheels    Recommendations for Other Services OT consult     Precautions / Restrictions Precautions Precautions: None Restrictions Weight Bearing Restrictions: No LLE Weight Bearing: Weight bearing as tolerated    Mobility  Bed Mobility Overal bed mobility: Needs Assistance Bed Mobility: Supine to Sit;Sit to Supine     Supine to sit: Min guard Sit to supine: Min guard   General bed mobility comments: cues for sequence and use of R LE to self assist  Transfers Overall transfer level: Needs assistance Equipment used: Rolling walker (2 wheeled) Transfers: Sit to/from Stand Sit to Stand: Min assist         General transfer comment: cues for LE management and use of UEs to self assist  Ambulation/Gait Ambulation/Gait assistance: Min guard Ambulation Distance (Feet): 250 Feet Assistive device: Rolling walker (2 wheeled) Gait Pattern/deviations: Step-to pattern;Step-through pattern;Decreased step length - right;Decreased step length - left;Shuffle;Trunk flexed Gait velocity: decr   General Gait Details: cues for posture, position from RW and initial sequence   Stairs Stairs: Yes Stairs assistance: Min guard Stair Management: Two rails;Forwards;Step to pattern Number of Stairs: 2 General stair comments: cues for sequence and foot placement  Wheelchair Mobility    Modified Rankin (Stroke Patients Only)       Balance                                    Cognition Arousal/Alertness: Awake/alert Behavior During Therapy: WFL for tasks  assessed/performed Overall Cognitive Status: Within Functional Limits for tasks assessed                      Exercises      General Comments        Pertinent Vitals/Pain Pain Assessment: 0-10 Pain Score: 4  Pain Location: L hip Pain Descriptors / Indicators: Aching;Sore Pain Intervention(s): Limited activity within patient's tolerance;Monitored during session;Premedicated before session;Ice applied    Home Living                      Prior Function            PT Goals (current goals can now be found in the care plan section) Acute Rehab PT Goals Patient Stated Goal: Resume previous lifestyle with decreased pain PT Goal Formulation: With patient Time For Goal Achievement: 09/02/14 Potential to Achieve Goals: Good Progress towards PT goals: Progressing toward goals    Frequency  7X/week    PT Plan Current plan remains appropriate    Co-evaluation             End of Session Equipment Utilized During Treatment: Gait belt Activity Tolerance: Patient tolerated treatment well Patient left: in bed;with call bell/phone within reach     Time: 1432-1456 PT Time Calculation (min) (ACUTE ONLY): 24 min  Charges:  $Gait Training: 23-37 mins                    G Codes:  Kehinde Totzke 08/28/2014, 4:37 PM

## 2014-08-28 NOTE — Op Note (Signed)
NAMEMARQUS, MACPHEE            ACCOUNT NO.:  [id number]  MEDICAL RECORD NO.:  [id number]  LOCATION:  5027                         FACILITY:  [facility]  PHYSICIAN:  Lind Guest. Ninfa Linden, M.D.DATE OF BIRTH:  25-Jun-1955  DATE OF PROCEDURE:  08/27/2014 DATE OF DISCHARGE:                              OPERATIVE REPORT   PREOPERATIVE DIAGNOSES:  Primary osteoarthritis and degenerative joint disease, left hip.  POSTOPERATIVE DIAGNOSES:  Primary osteoarthritis and degenerative joint disease, left hip.  PROCEDURE:  Left total hip arthroplasty through direct anterior approach.  IMPLANTS:  DePuy Sector Gription acetabular component size 54, with an apex hole eliminator, a single 30 mm screw followed by a 36+ 4 neutral polyethylene liner, size 10 Corail femoral component with standard offset, size 36+ 5 ceramic hip ball.  SURGEON:  Lind Guest. Ninfa Linden, M.D.  ASSISTANT:  Erskine Emery, P.A.  ANESTHESIA:  General.  ANTIBIOTICS:  900 mg of IV clindamycin.  BLOOD LOSS:  400 mL.  COMPLICATIONS:  None.  INDICATIONS:  Mr. Lizotte is a 59 year old gentleman with debilitating bilateral hip osteoarthritis.  He has almost complete obliteration of his joint space bilaterally with significant nearing, periarticular osteophytes, and left hip is much shorter than the right hip and due to this process has been going on for a long period of time, it has gotten worse where it has affected his activities of daily living, his mobility, and his quality of life.  It has been impacted so determinedly that he wished to proceed with total hip arthroplasty after the failure of conservative treatment.  He has been cleared by Cardiology for surgery and he understands the risks of acute blood loss anemia, nerve and vessel injury, fracture, infection, dislocation, and DVT.  He also understands cardiac risk.  He does understand our goals are decreased pain, improved mobility, and overall improved  quality of life.  PROCEDURE DESCRIPTION:  After informed consent was obtained, appropriate left hip was marked.  He was brought to the operating room.  General anesthesia was obtained while he was on his stretcher.  Traction boots were placed on both his feet and he was placed supine on the Hana fracture table with perineal post in place and both legs in inline skeletal traction device with no traction applied.  His left operative hip was then prepped and draped with DuraPrep and sterile drapes.  The time-out was called to identify correct patient, correct left hip.  We then made an incision inferior and posterior to the anterosuperior iliac spine and carried this obliquely down the leg.  We dissected down the tensor fascia lata muscle and tensor fascia was divided longitudinally, so we could proceed with a direct anterior approach to the hip.  We identified and cauterized the lateral femoral circumflex vessels and then I identified the hip joint and hip capsule, placed the Cobra retractor around the hip capsule.  We opened up the hip capsule and found to be significant scar down to where it almost to perform a capsulectomy due to this.  He has been shortened for so long and stiffened, the tissue was incredibly tight.  Once we were able to get the femoral neck exposed, we made our femoral neck cut with an  oscillating saw proximal to the lesser trochanter and completed this with an osteotome, placed a corkscrew guide in the femoral head and removed the femoral head in its entirety.  It was completely devoid of cartilage significant crater throughout.  The acetabulum was likewise devoid of cartilage.  We cleaned remnants of any labrum remaining and released to transverse acetabular ligament.  I placed a bent Hohmann across the medial acetabular rim.  I then began reaming under direct visualization from a size 42 all the way up to a size 54 with arm reamers under the leg visualization and  the last reamer under direct fluoroscopy, so we could obtain our depth of reaming, our inclination, and anteversion.  Once I was pleased with this, we were placed the real DePuy Sector Gription acetabular component size 54, an apex hole eliminator and a single screw.  We then placed the real 36+ 4 neutral polyethylene liner for this size acetabular component.  Attention was then turned to the femur.  It took [place] releasing quite a bit of soft tissue as well to get to the femur side of things included lateral joint capsule.  There was significant scar tissue around and contracted the tissue around the hip as well.  Once we gained access to the femoral canal using a box cutting osteotome and a rongeur to lateralize, we began broaching from a size 8, broach only up to a size 10 because its cortices were so thickened.  This was a nice tight fit.  We verified it was fit under fluoroscopy.  We then trialed a standard neck and a 36- 2 hip ball first due to the fact that he was so shortened before and contracted at all that we may have trouble reducing it.  It was reduced easily and under direct fluoroscopy, I felt like we could to go up 2 hip ball sizes, which we did trial and were pleased with.  We then dislocated the hip and removed the trial components.  We placed the real Corail femoral component size 10 with standard offset and the real 36+ 5 ceramic hip ball and reduced this back in the acetabulum, it was nice and tight.  We then irrigated the soft tissue with normal saline solution using pulsatile lavage.  There was no joint capsule we could close, so we then closed the iliotibial band with interrupted #1 Vicryl suture followed by 0 Vicryl in the deep tissue, 2-0 Vicryl in subcutaneous tissue, and staples on the skin.  Xeroform and an Aquacel dressing were applied.  He was then taken off the Hana table, awakened, extubated, and taken to the recovery room in stable condition.  All final  counts were correct and final blood loss 400 mL.  Of note, Erskine Emery, P.A., assisted in this entire case and his assistance was crucial for facilitating all aspects of this case.     Lind Guest. Ninfa Linden, M.D.     CYB/MEDQ  D:  08/27/2014  T:  08/28/2014  Job:  [id number]

## 2014-08-28 NOTE — Progress Notes (Addendum)
CSW consulted for possible snf placement. Pt evaluated by OT, and recommended home with no pt/ot follow up needed. Please consult if further CSW needs arise. No further Clinical Social Work needs, signing off.   Belia Heman, [place] weekend covering [place]

## 2014-08-28 NOTE — Progress Notes (Signed)
Subjective: Patient doing well sitting in chair has ambulated in hall   Objective: Vital signs in last 24 hours: Temp:  [97.5 F (36.4 C)-98.7 F (37.1 C)] 98.6 F (37 C) (05/21 1007) Pulse Rate:  [64-94] 82 (05/21 1007) Resp:  [10-18] 16 (05/21 1007) BP: (92-136)/(52-88) 102/62 mmHg (05/21 1007) SpO2:  [95 %-100 %] 100 % (05/21 1007)  Intake/Output from previous day: 05/20 0701 - 05/21 0700 In: 3354 [P.O.:480; I.V.:2774; IV Piggyback:100] Out: 975 [Urine:575; Blood:400] Intake/Output this shift: Total I/O In: 240 [P.O.:240] Out: -   Exam:  Neurovascular intact Sensation intact distally Intact pulses distally  Labs:  Recent Labs  08/28/14 0456  HGB 10.9*    Recent Labs  08/28/14 0456  WBC 11.8*  RBC 3.71*  HCT 34.4*  PLT 174    Recent Labs  08/28/14 0456  NA 135  K 4.8  CL 102  CO2 26  BUN 29*  CREATININE 1.71*  GLUCOSE 152*  CALCIUM 8.0*   No results for input(s): LABPT, INR in the last 72 hours.  Assessment/Plan: Plan to mobilize it PT today discharge tomorrow   DEAN,GREGORY SCOTT 08/28/2014, 10:31 AM

## 2014-08-28 NOTE — Evaluation (Signed)
Physical Therapy Evaluation Patient Details Name: [name] [name] MRN: [id number] DOB: 09-09-1955 Today's Date: 08/28/2014   History of Present Illness  Pt admitted for direct anterior THA Lt LE   Clinical Impression  Pt s/p L THR presents with decreased L LE strength/ROM and post op pain limiting functional mobility.  Pt should progress to dc home with family assist and HHPT follow up.    Follow Up Recommendations Home health PT    Equipment Recommendations  Rolling walker with 5" wheels    Recommendations for Other Services OT consult     Precautions / Restrictions Precautions Precautions: None Restrictions Weight Bearing Restrictions: No LLE Weight Bearing: Weight bearing as tolerated      Mobility  Bed Mobility Overal bed mobility: Needs Assistance Bed Mobility: Supine to Sit     Supine to sit: Min assist     General bed mobility comments: cues for sequence and use of R LE to self assist  Transfers Overall transfer level: Needs assistance Equipment used: Rolling walker (2 wheeled) Transfers: Sit to/from Stand Sit to Stand: Min assist Stand pivot transfers: Min guard       General transfer comment: cues for LE management and use of UEs to self assist  Ambulation/Gait Ambulation/Gait assistance: Min assist Ambulation Distance (Feet): 74 Feet Assistive device: Rolling walker (2 wheeled) Gait Pattern/deviations: Step-to pattern;Decreased step length - right;Decreased step length - left;Shuffle;Trunk flexed Gait velocity: decr   General Gait Details: cues for posture, position from RW and initial sequence  Stairs            Wheelchair Mobility    Modified Rankin (Stroke Patients Only)       Balance                                             Pertinent Vitals/Pain Pain Assessment: 0-10 Pain Score: 4  Pain Location: L hip Pain Descriptors / Indicators: Aching;Sore Pain Intervention(s): Limited activity within  patient's tolerance;Monitored during session;Premedicated before session;Ice applied    Home Living Family/patient expects to be discharged to:: Private residence Living Arrangements: Spouse/significant other Available Help at Discharge: Friend(s);Available PRN/intermittently;Family Type of Home: Apartment Home Access: Stairs to enter Entrance Stairs-Rails: Right Entrance Stairs-Number of Steps: 2 Home Layout: One level Home Equipment: Cane - quad;Grab bars - tub/shower;Grab bars - toilet      Prior Function Level of Independence: Independent with assistive device(s)               Hand Dominance   Dominant Hand: Right    Extremity/Trunk Assessment   Upper Extremity Assessment: Overall WFL for tasks assessed           Lower Extremity Assessment: LLE deficits/detail   LLE Deficits / Details: Hip strength 2+/5 with AAROM at hip to 85 flex and 20 abd  Cervical / Trunk Assessment: Normal  Communication   Communication: No difficulties  Cognition Arousal/Alertness: Awake/alert Behavior During Therapy: WFL for tasks assessed/performed Overall Cognitive Status: Within Functional Limits for tasks assessed                      General Comments      Exercises Total Joint Exercises Ankle Circles/Pumps: AROM;10 reps;Supine;Both Quad Sets: AROM;Both;10 reps;Supine Heel Slides: AAROM;Left;20 reps;Supine Hip ABduction/ADduction: AAROM;15 reps;Supine;Left      Assessment/Plan    PT Assessment Patient needs continued PT services  PT Diagnosis Difficulty walking   PT Problem List Decreased strength;Decreased range of motion;Decreased activity tolerance;Decreased mobility;Decreased knowledge of use of DME;Pain;Obesity  PT Treatment Interventions DME instruction;Gait training;Stair training;Functional mobility training;Therapeutic activities;Therapeutic exercise;Patient/family education   PT Goals (Current goals can be found in the Care Plan section) Acute Rehab  PT Goals Patient Stated Goal: Resume previous lifestyle with decreased pain PT Goal Formulation: With patient Time For Goal Achievement: 09/02/14 Potential to Achieve Goals: Good    Frequency 7X/week   Barriers to discharge        Co-evaluation               End of Session Equipment Utilized During Treatment: Gait belt Activity Tolerance: Patient tolerated treatment well Patient left: in chair;with call bell/phone within reach;with family/visitor present Nurse Communication: Mobility status         Time: 8756-4332 PT Time Calculation (min) (ACUTE ONLY): 30 min   Charges:   PT Evaluation $Initial PT Evaluation Tier I: 1 Procedure PT Treatments $Therapeutic Exercise: 8-22 mins   PT G Codes:        Deagen Krass 09-27-14, 12:54 PM

## 2014-08-29 LAB — CBC
HCT: 31.4 % — ABNORMAL LOW (ref 39.0–52.0)
Hemoglobin: 10.3 g/dL — ABNORMAL LOW (ref 13.0–17.0)
MCH: 30.6 pg (ref 26.0–34.0)
MCHC: 32.8 g/dL (ref 30.0–36.0)
MCV: 93.2 fL (ref 78.0–100.0)
Platelets: 181 10*3/uL (ref 150–400)
RBC: 3.37 MIL/uL — ABNORMAL LOW (ref 4.22–5.81)
RDW: 14.6 % (ref 11.5–15.5)
WBC: 10.6 10*3/uL — ABNORMAL HIGH (ref 4.0–10.5)

## 2014-08-29 MED ORDER — OXYCODONE HCL 5 MG PO TABS: 5.0000 mg | ORAL_TABLET | ORAL | Status: DC | PRN

## 2014-08-29 MED ORDER — METHOCARBAMOL 500 MG PO TABS: 500.0000 mg | ORAL_TABLET | Freq: Four times a day (QID) | ORAL | Status: DC | PRN

## 2014-08-29 MED ORDER — ASPIRIN 325 MG PO TBEC: 325.0000 mg | DELAYED_RELEASE_TABLET | Freq: Two times a day (BID) | ORAL | Status: DC

## 2014-08-29 NOTE — Care Management Note (Signed)
Case Management Note  Patient Details  Name: [name] [name] MRN: [id number] Date of Birth: [birth year]  Subjective/Objective:                    Action/Plan:   Expected Discharge Date: 08/29/14                 Expected Discharge Plan:  [place]  In-House Referral:     Discharge planning Services  CM Consult  Post Acute Care Choice:  Home Health Choice offered to:  Patient  DME Arranged:  3-N-1, Walker rolling DME Agency:  [place]:  PT [facility] Agency:  [place]  Status of Service:  Completed, signed off  Medicare Important Message Given:    Date Medicare IM Given:    Medicare IM give by:    Date Additional Medicare IM Given:    Additional Medicare Important Message give by:     If discussed at [place] of Stay Meetings, dates discussed:    Additional Comments:  CM spoke with patient at the bedside. Patient has selected [name] [name] for home health PT. Needs a rolling walker and 3N1. [name] [name] at [facility] notified of DME request.   [name] Schneiders, RN 08/29/2014, 9:32 AM

## 2014-08-29 NOTE — Progress Notes (Signed)
Physical Therapy Treatment Patient Details Name: [name] [name] MRN: [id number] DOB: 02/14/1956 Today's Date: September 19, 2014    History of Present Illness Pt admitted for direct anterior THA Lt LE     PT Comments    POD # 2 pt eager to D/C to home today.  Assisted with amb in hallway, practiced 2 steps with B rails then perform THR TE's following HEP handout.  Instructed on proper freq and use of ICE. Pt ready for D/c to home.  Follow Up Recommendations  Home health PT     Equipment Recommendations  Rolling walker with 5" wheels    Recommendations for Other Services       Precautions / Restrictions Precautions Precautions: None Restrictions Weight Bearing Restrictions: No LLE Weight Bearing: Weight bearing as tolerated    Mobility  Bed Mobility               General bed mobility comments: Pt OOB in recliner  Transfers Overall transfer level: Needs assistance Equipment used: Rolling walker (2 wheeled) Transfers: Sit to/from Stand Sit to Stand: Supervision         General transfer comment: 25% VC's on proper hand placement and safety with turns  Ambulation/Gait Ambulation/Gait assistance: Supervision Ambulation Distance (Feet): 135 Feet Assistive device: Rolling walker (2 wheeled) Gait Pattern/deviations: Step-to pattern;Step-through pattern;Trunk flexed Gait velocity: decreased       Stairs Stairs: Yes Stairs assistance: Min guard Stair Management: Two rails;Step to pattern;Forwards Number of Stairs: 2 General stair comments: one VC on proper sequencing and safety  Wheelchair Mobility    Modified Rankin (Stroke Patients Only)       Balance                                    Cognition Arousal/Alertness: Awake/alert Behavior During Therapy: WFL for tasks assessed/performed Overall Cognitive Status: Within Functional Limits for tasks assessed                      Exercises   Total Hip Replacement TE's 10 reps  ankle pumps 10 reps knee presses 10 reps heel slides 10 reps SAQ's 10 reps ABD Followed by ICE     General Comments        Pertinent Vitals/Pain Pain Assessment: 0-10 Pain Score: 5  Pain Location: L hip Pain Descriptors / Indicators: Aching;Sore Pain Intervention(s): Monitored during session;Repositioned;Ice applied    Home Living                      Prior Function            PT Goals (current goals can now be found in the care plan section) Progress towards PT goals: Progressing toward goals    Frequency  7X/week    PT Plan      Co-evaluation             End of Session Equipment Utilized During Treatment: Gait belt Activity Tolerance: Patient tolerated treatment well Patient left: in chair;with call bell/phone within reach     Time: 0850-0930 PT Time Calculation (min) (ACUTE ONLY): 40 min  Charges:  $Gait Training: 8-22 mins $Therapeutic Exercise: 8-22 mins $Therapeutic Activity: 8-22 mins                    G Codes:      Nathanial Rancher September 19, 2014, 12:06 PM

## 2014-08-29 NOTE — Progress Notes (Signed)
New nicotene patch applied,previous one came off by pt.Pt has been restless,getting oob by himself impulsiveness noted.High fall risk measures in place.Pt reminded constantly not to get oob by himself Aggie Moats D

## 2014-08-29 NOTE — Progress Notes (Signed)
Patient doing well Ambulating in room DC today

## 2014-09-14 NOTE — Discharge Summary (Signed)
Patient ID: [name] [name] MRN: [id number] DOB/AGE: [birth year] 59 y.o.  Admit date: 08/27/2014 Discharge date: 09/14/2014  Admission Diagnoses:  Principal Problem:   Osteoarthritis of left hip Active Problems:   Status post total replacement of left hip   Discharge Diagnoses:  Same  Past Medical History  Diagnosis Date  . Morbid obesity   . HTN (hypertension)   . Chronic systolic heart failure   . Cardiomyopathy     secondary   . OSA (obstructive sleep apnea)   . Coronary artery disease   . CHF (congestive heart failure)   . Arthritis   . Cancer 2007    colon cancer    Surgeries: Procedure(s): LEFT TOTAL HIP ARTHROPLASTY ANTERIOR APPROACH on 08/27/2014   Consultants:    Discharged Condition: Improved  Hospital Course: [name] is an 59 y.o. male who was admitted 08/27/2014 for operative treatment ofOsteoarthritis of left hip. Patient has severe unremitting pain that affects sleep, daily activities, and work/hobbies. After pre-op clearance the patient was taken to the operating room on 08/27/2014 and underwent  Procedure(s): LEFT TOTAL HIP ARTHROPLASTY ANTERIOR APPROACH.    Patient was given perioperative antibiotics:  Anti-infectives    Start     Dose/Rate Route Frequency Ordered Stop   08/27/14 1800  clindamycin (CLEOCIN) IVPB 600 mg     600 mg 100 mL/hr over 30 Minutes Intravenous Every 6 hours 08/27/14 1550 08/28/14 0126   08/27/14 0931  clindamycin (CLEOCIN) IVPB 900 mg     900 mg 100 mL/hr over 30 Minutes Intravenous On call to O.R. 08/27/14 0931 08/27/14 1149       Patient was given sequential compression devices, early ambulation, and chemoprophylaxis to prevent DVT.  Patient benefited maximally from hospital stay and there were no complications.    Recent vital signs: No data found.    Recent laboratory studies: No results for input(s): WBC, HGB, HCT, PLT, NA, K, CL, CO2, BUN, CREATININE, GLUCOSE, INR, CALCIUM in the last 72  hours.  Invalid input(s): PT, 2   Discharge Medications:     Medication List    STOP taking these medications        diclofenac 75 MG EC tablet  Commonly known as:  VOLTAREN      TAKE these medications        amLODipine 10 MG tablet  Commonly known as:  NORVASC  TAKE 1 TABLET BY MOUTH EVERY DAY     aspirin 325 MG EC tablet  Take 1 tablet (325 mg total) by mouth 2 (two) times daily after a meal.     carvedilol 6.25 MG tablet  Commonly known as:  COREG  Take 6.25 mg by mouth 2 (two) times daily.     chlorthalidone 25 MG tablet  Commonly known as:  HYGROTON  Take 25 mg by mouth every morning.     lisinopril 40 MG tablet  Commonly known as:  PRINIVIL,ZESTRIL  Take 40 mg by mouth every morning.     methocarbamol 500 MG tablet  Commonly known as:  ROBAXIN  Take 1 tablet (500 mg total) by mouth every 6 (six) hours as needed for muscle spasms.     oxyCODONE 5 MG immediate release tablet  Commonly known as:  Oxy IR/ROXICODONE  Take 1-3 tablets (5-15 mg total) by mouth every 3 (three) hours as needed for breakthrough pain.     traMADol 50 MG tablet  Commonly known as:  ULTRAM  Take 50 mg by mouth 3 (three) times daily  as needed for moderate pain.        Diagnostic Studies: Dg C-arm 61-120 Min-no Report  08/27/2014   CLINICAL DATA:  Left total hip replacement.  EXAM: LEFT HIP (WITH PELVIS) 1 VIEW PORTABLE; DG C-ARM 61-120 MIN - NRPT [facility]  COMPARISON:  None.  FINDINGS: Two intraoperative fluoroscopic images of the pelvis demonstrate the patient be status post left total hip arthroplasty. The femoral and acetabular components appear to be well situated. No fracture or dislocation is noted.  IMPRESSION: Status post left total hip arthroplasty.   Electronically Signed   By: Marijo Conception, M.D.   On: 08/27/2014 13:44   Dg Hip Port Unilat With Pelvis 1v Left  08/27/2014   CLINICAL DATA:  Left hip replacement  EXAM: LEFT HIP (WITH PELVIS) 1 VIEW PORTABLE  COMPARISON:  Film from  earlier in the same day  FINDINGS: Left hip replacement is noted similar to that seen on earlier film. No acute bony abnormality is seen. Severe degenerative changes of the right proximal femur and hip joint are noted. The pelvic ring is intact.  IMPRESSION: Status post left hip replacement.   Electronically Signed   By: [name] M.D.   On: 08/27/2014 16:21   Dg Hip Port Unilat With Pelvis 1v Left  08/27/2014   CLINICAL DATA:  Osteoarthritis of the left hip. Left total hip prosthesis insertion.  EXAM: LEFT HIP (WITH PELVIS) 1 VIEW PORTABLE  COMPARISON:  None.  FINDINGS: AP and lateral portable views demonstrate the acetabular and femoral components of the left total hip prosthesis appear in excellent position. No fractures.  Severe arthritis of the right hip is noted.  IMPRESSION: Satisfactory appearance of the left hip after total hip prosthesis insertion.   Electronically Signed   By: [name] M.D.   On: 08/27/2014 15:00   Dg Hip Port Unilat With Pelvis 1v Left  08/27/2014   CLINICAL DATA:  Left total hip replacement.  EXAM: LEFT HIP (WITH PELVIS) 1 VIEW PORTABLE; DG C-ARM 61-120 MIN - NRPT [facility]  COMPARISON:  None.  FINDINGS: Two intraoperative fluoroscopic images of the pelvis demonstrate the patient be status post left total hip arthroplasty. The femoral and acetabular components appear to be well situated. No fracture or dislocation is noted.  IMPRESSION: Status post left total hip arthroplasty.   Electronically Signed   By: Marijo Conception, M.D.   On: 08/27/2014 13:44    Disposition: 06-Home-Health Care Svc      Discharge Instructions    Call MD / Call 911    Complete by:  As directed   If you experience chest pain or shortness of breath, CALL 911 and be transported to the hospital emergency room.  If you develope a fever above 101 F, pus (white drainage) or increased drainage or redness at the wound, or calf pain, call your surgeon's office.     Constipation Prevention    Complete  by:  As directed   Drink plenty of fluids.  Prune juice may be helpful.  You may use a stool softener, such as Colace (over the counter) 100 mg twice a day.  Use MiraLax (over the counter) for constipation as needed.     Diet - low sodium heart healthy    Complete by:  As directed      Increase activity slowly as tolerated    Complete by:  As directed            Follow-up Information  Follow up with [facility].   Why:  home health physical therapy   Contact information:   [address] [address] [phone]        Signed: [name] 09/14/2014, 9:56 PM

## 2014-10-25 ENCOUNTER — Other Ambulatory Visit (HOSPITAL_COMMUNITY): Payer: Self-pay | Admitting: Orthopaedic Surgery

## 2014-11-02 NOTE — Patient Instructions (Signed)
Shalom Mcguiness [name] [name]  11/02/2014   Your procedure is scheduled on: Friday 11/12/2014  Report to [facility]  Entrance take [place]  elevators to 3rd floor to  [place] at  0750 AM.  Call this number if you have problems the morning of surgery [phone]   Remember: ONLY 1 PERSON MAY GO WITH YOU TO SHORT STAY TO GET  READY MORNING OF [place].  Do not eat food or drink liquids :After Midnight.     Take these medicines the morning of surgery with A SIP OF WATER: Amlodipine (Norvasc), Carvedilol (Coreg)                               You may not have any metal on your body including hair pins and              piercings  Do not wear jewelry, make-up, lotions, powders or perfumes, deodorant             Do not wear nail polish.  Do not shave  48 hours prior to surgery.              Men may shave face and neck.   Do not bring valuables to the hospital. [place].  Contacts, dentures or bridgework may not be worn into surgery.  Leave suitcase in the car. After surgery it may be brought to your room.     Patients discharged the day of surgery will not be allowed to drive home.  Name and phone number of your driver:  Special Instructions: N/A              Please read over the following fact sheets you were given: _____________________________________________________________________             [facility] - Preparing for Surgery Before surgery, you can play an important role.  Because skin is not sterile, your skin needs to be as free of germs as possible.  You can reduce the number of germs on your skin by washing with CHG (chlorahexidine gluconate) soap before surgery.  CHG is an antiseptic cleaner which kills germs and bonds with the skin to continue killing germs even after washing. Please DO NOT use if you have an allergy to CHG or antibacterial soaps.  If your skin becomes reddened/irritated stop  using the CHG and inform your nurse when you arrive at Short Stay. Do not shave (including legs and underarms) for at least 48 hours prior to the first CHG shower.  You may shave your face/neck. Please follow these instructions carefully:  1.  Shower with CHG Soap the night before surgery and the  morning of Surgery.  2.  If you choose to wash your hair, wash your hair first as usual with your  normal  shampoo.  3.  After you shampoo, rinse your hair and body thoroughly to remove the  shampoo.                           4.  Use CHG as you would any other liquid soap.  You can apply chg directly  to the skin and wash  Gently with a scrungie or clean washcloth.  5.  Apply the CHG Soap to your body ONLY FROM THE NECK DOWN.   Do not use on face/ open                           Wound or open sores. Avoid contact with eyes, ears mouth and genitals (private parts).                       Wash face,  Genitals (private parts) with your normal soap.             6.  Wash thoroughly, paying special attention to the area where your surgery  will be performed.  7.  Thoroughly rinse your body with warm water from the neck down.  8.  DO NOT shower/wash with your normal soap after using and rinsing off  the CHG Soap.                9.  Pat yourself dry with a clean towel.            10.  Wear clean pajamas.            11.  Place clean sheets on your bed the night of your first shower and do not  sleep with pets. Day of Surgery : Do not apply any lotions/deodorants the morning of surgery.  Please wear clean clothes to the hospital/surgery center.  FAILURE TO FOLLOW THESE INSTRUCTIONS MAY RESULT IN THE CANCELLATION OF YOUR SURGERY PATIENT SIGNATURE_________________________________  NURSE SIGNATURE__________________________________  ________________________________________________________________________   Adam Phenix  An incentive spirometer is a tool that can help keep your  lungs clear and active. This tool measures how well you are filling your lungs with each breath. Taking long deep breaths may help reverse or decrease the chance of developing breathing (pulmonary) problems (especially infection) following:  A long period of time when you are unable to move or be active. BEFORE THE PROCEDURE   If the spirometer includes an indicator to show your best effort, your nurse or respiratory therapist will set it to a desired goal.  If possible, sit up straight or lean slightly forward. Try not to slouch.  Hold the incentive spirometer in an upright position. INSTRUCTIONS FOR USE   Sit on the edge of your bed if possible, or sit up as far as you can in bed or on a chair.  Hold the incentive spirometer in an upright position.  Breathe out normally.  Place the mouthpiece in your mouth and seal your lips tightly around it.  Breathe in slowly and as deeply as possible, raising the piston or the ball toward the top of the column.  Hold your breath for 3-5 seconds or for as long as possible. Allow the piston or ball to fall to the bottom of the column.  Remove the mouthpiece from your mouth and breathe out normally.  Rest for a few seconds and repeat Steps 1 through 7 at least 10 times every 1-2 hours when you are awake. Take your time and take a few normal breaths between deep breaths.  The spirometer may include an indicator to show your best effort. Use the indicator as a goal to work toward during each repetition.  After each set of 10 deep breaths, practice coughing to be sure your lungs are clear. If you have an incision (the cut made at the time of surgery),  support your incision when coughing by placing a pillow or rolled up towels firmly against it. Once you are able to get out of bed, walk around indoors and cough well. You may stop using the incentive spirometer when instructed by your caregiver.  RISKS AND COMPLICATIONS  Take your time so you do not  get dizzy or light-headed.  If you are in pain, you may need to take or ask for pain medication before doing incentive spirometry. It is harder to take a deep breath if you are having pain. AFTER USE  Rest and breathe slowly and easily.  It can be helpful to keep track of a log of your progress. Your caregiver can provide you with a simple table to help with this. If you are using the spirometer at home, follow these instructions: [place] IF:   You are having difficultly using the spirometer.  You have trouble using the spirometer as often as instructed.  Your pain medication is not giving enough relief while using the spirometer.  You develop fever of 100.5 F (38.1 C) or higher. SEEK IMMEDIATE MEDICAL CARE IF:   You cough up bloody sputum that had not been present before.  You develop fever of 102 F (38.9 C) or greater.  You develop worsening pain at or near the incision site. MAKE SURE YOU:   Understand these instructions.  Will watch your condition.  Will get help right away if you are not doing well or get worse. Document Released: 08/06/2006 Document Revised: 06/18/2011 Document Reviewed: 10/07/2006 Exit[employer] Patient Information 2014 Exit[employer], [place].   ________________________________________________________________________  WHAT IS A BLOOD TRANSFUSION? Blood Transfusion Information  A transfusion is the replacement of blood or some of its parts. Blood is made up of multiple cells which provide different functions.  Red blood cells carry oxygen and are used for blood loss replacement.  White blood cells fight against infection.  Platelets control bleeding.  Plasma helps clot blood.  Other blood products are available for specialized needs, such as hemophilia or other clotting disorders. BEFORE THE TRANSFUSION  Who gives blood for transfusions?   Healthy volunteers who are fully evaluated to make sure their blood is safe. This is blood bank  blood. Transfusion therapy is the safest it has ever been in the practice of medicine. Before blood is taken from a donor, a complete history is taken to make sure that person has no history of diseases nor engages in risky social behavior (examples are intravenous drug use or sexual activity with multiple partners). The donor's travel history is screened to minimize risk of transmitting infections, such as malaria. The donated blood is tested for signs of infectious diseases, such as HIV and hepatitis. The blood is then tested to be sure it is compatible with you in order to minimize the chance of a transfusion reaction. If you or a relative donates blood, this is often done in anticipation of surgery and is not appropriate for emergency situations. It takes many days to process the donated blood. RISKS AND COMPLICATIONS Although transfusion therapy is very safe and saves many lives, the main dangers of transfusion include:   Getting an infectious disease.  Developing a transfusion reaction. This is an allergic reaction to something in the blood you were given. Every precaution is taken to prevent this. The decision to have a blood transfusion has been considered carefully by your caregiver before blood is given. Blood is not given unless the benefits outweigh the risks. AFTER THE TRANSFUSION  Right after receiving a blood transfusion, you will usually feel much better and more energetic. This is especially true if your red blood cells have gotten low (anemic). The transfusion raises the level of the red blood cells which carry oxygen, and this usually causes an energy increase.  The nurse administering the transfusion will monitor you carefully for complications. HOME CARE INSTRUCTIONS  No special instructions are needed after a transfusion. You may find your energy is better. Speak with your caregiver about any limitations on activity for underlying diseases you may have. SEEK MEDICAL CARE IF:    Your condition is not improving after your transfusion.  You develop redness or irritation at the intravenous (IV) site. SEEK IMMEDIATE MEDICAL CARE IF:  Any of the following symptoms occur over the next 12 hours:  Shaking chills.  You have a temperature by mouth above 102 F (38.9 C), not controlled by medicine.  Chest, back, or muscle pain.  People around you feel you are not acting correctly or are confused.  Shortness of breath or difficulty breathing.  Dizziness and fainting.  You get a rash or develop hives.  You have a decrease in urine output.  Your urine turns a dark color or changes to pink, red, or brown. Any of the following symptoms occur over the next 10 days:  You have a temperature by mouth above 102 F (38.9 C), not controlled by medicine.  Shortness of breath.  Weakness after normal activity.  The white part of the eye turns yellow (jaundice).  You have a decrease in the amount of urine or are urinating less often.  Your urine turns a dark color or changes to pink, red, or brown. Document Released: 03/23/2000 Document Revised: 06/18/2011 Document Reviewed: 11/10/2007 [facility] Patient Information 2014 [place], [place].  _______________________________________________________________________

## 2014-11-03 ENCOUNTER — Encounter (HOSPITAL_COMMUNITY)
Admission: RE | Admit: 2014-11-03 | Discharge: 2014-11-03 | Disposition: A | Discharge status: Home / Self Care | Payer: Medicare Other | Source: Ambulatory Visit | Attending: Orthopaedic Surgery | Admitting: Orthopaedic Surgery

## 2014-11-03 ENCOUNTER — Encounter (HOSPITAL_COMMUNITY): Payer: Self-pay

## 2014-11-03 DIAGNOSIS — Z01818 Encounter for other preprocedural examination: Secondary | ICD-10-CM | POA: Insufficient documentation

## 2014-11-03 DIAGNOSIS — M1611 Unilateral primary osteoarthritis, right hip: Secondary | ICD-10-CM | POA: Insufficient documentation

## 2014-11-03 LAB — BASIC METABOLIC PANEL
ANION GAP: 8 (ref 5–15)
BUN: 25 mg/dL — ABNORMAL HIGH (ref 6–20)
CHLORIDE: 105 mmol/L (ref 101–111)
CO2: 26 mmol/L (ref 22–32)
Calcium: 9.3 mg/dL (ref 8.9–10.3)
Creatinine, Ser: 1.5 mg/dL — ABNORMAL HIGH (ref 0.61–1.24)
GFR calc non Af Amer: 49 mL/min — ABNORMAL LOW (ref >60)
GFR, EST AFRICAN AMERICAN: 56 mL/min — AB (ref >60)
Glucose, Bld: 111 mg/dL — ABNORMAL HIGH (ref 65–99)
POTASSIUM: 4.5 mmol/L (ref 3.5–5.1)
Sodium: 139 mmol/L (ref 135–145)

## 2014-11-03 LAB — CBC
HCT: 42.7 % (ref 39.0–52.0)
HEMOGLOBIN: 14 g/dL (ref 13.0–17.0)
MCH: 30.8 pg (ref 26.0–34.0)
MCHC: 32.8 g/dL (ref 30.0–36.0)
MCV: 94.1 fL (ref 78.0–100.0)
Platelets: 226 10*3/uL (ref 150–400)
RBC: 4.54 MIL/uL (ref 4.22–5.81)
RDW: 13.2 % (ref 11.5–15.5)
WBC: 7.4 10*3/uL (ref 4.0–10.5)

## 2014-11-03 LAB — APTT: aPTT: 38 seconds — ABNORMAL HIGH (ref 24–37)

## 2014-11-03 LAB — PROTIME-INR
INR: 1.19 (ref 0.00–1.49)
PROTHROMBIN TIME: 15.2 s (ref 11.6–15.2)

## 2014-11-03 LAB — SURGICAL PCR SCREEN
MRSA, PCR: NEGATIVE
STAPHYLOCOCCUS AUREUS: NEGATIVE

## 2014-11-04 NOTE — Progress Notes (Signed)
01/29/2014-noted in [facility]

## 2014-11-11 LAB — TYPE AND SCREEN
ABO/RH(D): O POS
Antibody Screen: NEGATIVE

## 2014-11-12 ENCOUNTER — Encounter (HOSPITAL_COMMUNITY)
Admission: RE | Disposition: A | Discharge status: Home Health | Payer: Self-pay | Source: Ambulatory Visit | Attending: Orthopaedic Surgery

## 2014-11-12 ENCOUNTER — Encounter (HOSPITAL_COMMUNITY): Payer: Self-pay | Admitting: *Deleted

## 2014-11-12 ENCOUNTER — Inpatient Hospital Stay (HOSPITAL_COMMUNITY): Payer: Medicare Other

## 2014-11-12 ENCOUNTER — Inpatient Hospital Stay (HOSPITAL_COMMUNITY): Payer: Medicare Other | Admitting: Certified Registered Nurse Anesthetist

## 2014-11-12 ENCOUNTER — Inpatient Hospital Stay (HOSPITAL_COMMUNITY)
Admission: RE | Admit: 2014-11-12 | Discharge: 2014-11-14 | DRG: 470 | Disposition: A | Discharge status: Home Health | Payer: Medicare Other | Source: Ambulatory Visit | Attending: Orthopaedic Surgery | Admitting: Orthopaedic Surgery

## 2014-11-12 DIAGNOSIS — D62 Acute posthemorrhagic anemia: Secondary | ICD-10-CM | POA: Diagnosis not present

## 2014-11-12 DIAGNOSIS — I5022 Chronic systolic (congestive) heart failure: Secondary | ICD-10-CM | POA: Diagnosis present

## 2014-11-12 DIAGNOSIS — I429 Cardiomyopathy, unspecified: Secondary | ICD-10-CM | POA: Diagnosis present

## 2014-11-12 DIAGNOSIS — F17210 Nicotine dependence, cigarettes, uncomplicated: Secondary | ICD-10-CM | POA: Diagnosis present

## 2014-11-12 DIAGNOSIS — M25551 Pain in right hip: Secondary | ICD-10-CM | POA: Diagnosis present

## 2014-11-12 DIAGNOSIS — G4733 Obstructive sleep apnea (adult) (pediatric): Secondary | ICD-10-CM | POA: Diagnosis present

## 2014-11-12 DIAGNOSIS — M1611 Unilateral primary osteoarthritis, right hip: Secondary | ICD-10-CM | POA: Diagnosis present

## 2014-11-12 DIAGNOSIS — Z01812 Encounter for preprocedural laboratory examination: Secondary | ICD-10-CM

## 2014-11-12 DIAGNOSIS — Z6841 Body Mass Index (BMI) 40.0 and over, adult: Secondary | ICD-10-CM

## 2014-11-12 DIAGNOSIS — I10 Essential (primary) hypertension: Secondary | ICD-10-CM | POA: Diagnosis present

## 2014-11-12 DIAGNOSIS — Z96641 Presence of right artificial hip joint: Secondary | ICD-10-CM

## 2014-11-12 DIAGNOSIS — Z85038 Personal history of other malignant neoplasm of large intestine: Secondary | ICD-10-CM | POA: Diagnosis not present

## 2014-11-12 DIAGNOSIS — Z96642 Presence of left artificial hip joint: Secondary | ICD-10-CM | POA: Diagnosis present

## 2014-11-12 DIAGNOSIS — I2510 Atherosclerotic heart disease of native coronary artery without angina pectoris: Secondary | ICD-10-CM | POA: Diagnosis present

## 2014-11-12 HISTORY — PX: TOTAL HIP ARTHROPLASTY: SHX124

## 2014-11-12 SURGERY — ARTHROPLASTY, HIP, TOTAL, ANTERIOR APPROACH
Anesthesia: General | Site: Hip | Laterality: Right

## 2014-11-12 MED ORDER — LIDOCAINE HCL (CARDIAC) 20 MG/ML IV SOLN
INTRAVENOUS | Status: DC | PRN
  Administered 2014-11-12: 50 mg via INTRAVENOUS

## 2014-11-12 MED ORDER — MEPERIDINE HCL 50 MG/ML IJ SOLN: 6.2500 mg | INTRAMUSCULAR | Status: DC | PRN

## 2014-11-12 MED ORDER — MIDAZOLAM HCL 2 MG/2ML IJ SOLN
INTRAMUSCULAR | Status: AC
  Filled 2014-11-12: qty 4

## 2014-11-12 MED ORDER — ACETAMINOPHEN 325 MG PO TABS: 650.0000 mg | ORAL_TABLET | Freq: Four times a day (QID) | ORAL | Status: DC | PRN

## 2014-11-12 MED ORDER — PROPOFOL 10 MG/ML IV BOLUS
INTRAVENOUS | Status: DC | PRN
  Administered 2014-11-12: 180 mg via INTRAVENOUS

## 2014-11-12 MED ORDER — ONDANSETRON HCL 4 MG/2ML IJ SOLN
INTRAMUSCULAR | Status: DC | PRN
  Administered 2014-11-12: 4 mg via INTRAVENOUS

## 2014-11-12 MED ORDER — MIDAZOLAM HCL 5 MG/5ML IJ SOLN
INTRAMUSCULAR | Status: DC | PRN
  Administered 2014-11-12: 2 mg via INTRAVENOUS

## 2014-11-12 MED ORDER — PHENYLEPHRINE HCL 10 MG/ML IJ SOLN
INTRAMUSCULAR | Status: DC | PRN
  Administered 2014-11-12 (×2): 80 ug via INTRAVENOUS
  Administered 2014-11-12: 120 ug via INTRAVENOUS
  Administered 2014-11-12: 80 ug via INTRAVENOUS
  Administered 2014-11-12: 120 ug via INTRAVENOUS

## 2014-11-12 MED ORDER — PROMETHAZINE HCL 25 MG/ML IJ SOLN: 6.2500 mg | INTRAMUSCULAR | Status: DC | PRN

## 2014-11-12 MED ORDER — CLINDAMYCIN PHOSPHATE 900 MG/50ML IV SOLN
INTRAVENOUS | Status: AC
  Filled 2014-11-12: qty 50

## 2014-11-12 MED ORDER — ROCURONIUM BROMIDE 100 MG/10ML IV SOLN
INTRAVENOUS | Status: DC | PRN
  Administered 2014-11-12: 50 mg via INTRAVENOUS

## 2014-11-12 MED ORDER — CLINDAMYCIN PHOSPHATE 600 MG/50ML IV SOLN
600.0000 mg | Freq: Four times a day (QID) | INTRAVENOUS | Status: AC
  Administered 2014-11-12 (×2): 600 mg via INTRAVENOUS
  Filled 2014-11-12 (×2): qty 50

## 2014-11-12 MED ORDER — ASPIRIN EC 325 MG PO TBEC
325.0000 mg | DELAYED_RELEASE_TABLET | Freq: Two times a day (BID) | ORAL | Status: DC
  Administered 2014-11-13 – 2014-11-14 (×3): 325 mg via ORAL
  Filled 2014-11-12 (×5): qty 1

## 2014-11-12 MED ORDER — AMLODIPINE BESYLATE 10 MG PO TABS
10.0000 mg | ORAL_TABLET | Freq: Every day | ORAL | Status: DC
  Filled 2014-11-12 (×2): qty 1

## 2014-11-12 MED ORDER — METOCLOPRAMIDE HCL 5 MG/ML IJ SOLN: 5.0000 mg | Freq: Three times a day (TID) | INTRAMUSCULAR | Status: DC | PRN

## 2014-11-12 MED ORDER — CLINDAMYCIN PHOSPHATE 900 MG/50ML IV SOLN
900.0000 mg | INTRAVENOUS | Status: AC
  Administered 2014-11-12: 900 mg via INTRAVENOUS

## 2014-11-12 MED ORDER — FENTANYL CITRATE (PF) 100 MCG/2ML IJ SOLN
INTRAMUSCULAR | Status: DC | PRN
  Administered 2014-11-12 (×2): 25 ug via INTRAVENOUS
  Administered 2014-11-12: 50 ug via INTRAVENOUS
  Administered 2014-11-12 (×2): 100 ug via INTRAVENOUS

## 2014-11-12 MED ORDER — TRANEXAMIC ACID 1000 MG/10ML IV SOLN
1000.0000 mg | INTRAVENOUS | Status: AC
  Administered 2014-11-12: 1000 mg via INTRAVENOUS
  Filled 2014-11-12: qty 10

## 2014-11-12 MED ORDER — CARVEDILOL 6.25 MG PO TABS
6.2500 mg | ORAL_TABLET | Freq: Two times a day (BID) | ORAL | Status: DC
  Administered 2014-11-12 – 2014-11-14 (×2): 6.25 mg via ORAL
  Filled 2014-11-12 (×5): qty 1

## 2014-11-12 MED ORDER — FENTANYL CITRATE (PF) 250 MCG/5ML IJ SOLN
INTRAMUSCULAR | Status: AC
  Filled 2014-11-12: qty 25

## 2014-11-12 MED ORDER — HYDROMORPHONE HCL 1 MG/ML IJ SOLN
1.0000 mg | INTRAMUSCULAR | Status: DC | PRN
  Administered 2014-11-12: 1 mg via INTRAVENOUS
  Filled 2014-11-12: qty 1

## 2014-11-12 MED ORDER — CHLORTHALIDONE 25 MG PO TABS
25.0000 mg | ORAL_TABLET | Freq: Every morning | ORAL | Status: DC
  Administered 2014-11-13: 25 mg via ORAL
  Filled 2014-11-12 (×3): qty 1

## 2014-11-12 MED ORDER — DIPHENHYDRAMINE HCL 12.5 MG/5ML PO ELIX: 12.5000 mg | ORAL_SOLUTION | ORAL | Status: DC | PRN

## 2014-11-12 MED ORDER — ZOLPIDEM TARTRATE 5 MG PO TABS: 5.0000 mg | ORAL_TABLET | Freq: Every evening | ORAL | Status: DC | PRN

## 2014-11-12 MED ORDER — PHENYLEPHRINE 40 MCG/ML (10ML) SYRINGE FOR IV PUSH (FOR BLOOD PRESSURE SUPPORT)
PREFILLED_SYRINGE | INTRAVENOUS | Status: AC
  Filled 2014-11-12: qty 10

## 2014-11-12 MED ORDER — LACTATED RINGERS IV SOLN
INTRAVENOUS | Status: DC
  Administered 2014-11-12: 12:00:00 via INTRAVENOUS
  Administered 2014-11-12: 1000 mL via INTRAVENOUS

## 2014-11-12 MED ORDER — LACTATED RINGERS IV SOLN: INTRAVENOUS | Status: DC

## 2014-11-12 MED ORDER — SODIUM CHLORIDE 0.9 % IV SOLN
INTRAVENOUS | Status: DC
  Administered 2014-11-12 – 2014-11-13 (×2): via INTRAVENOUS

## 2014-11-12 MED ORDER — ONDANSETRON HCL 4 MG/2ML IJ SOLN: 4.0000 mg | Freq: Four times a day (QID) | INTRAMUSCULAR | Status: DC | PRN

## 2014-11-12 MED ORDER — PROPOFOL 10 MG/ML IV BOLUS
INTRAVENOUS | Status: AC
  Filled 2014-11-12: qty 20

## 2014-11-12 MED ORDER — PHENOL 1.4 % MT LIQD
1.0000 | OROMUCOSAL | Status: DC | PRN
  Filled 2014-11-12: qty 177

## 2014-11-12 MED ORDER — FENTANYL CITRATE (PF) 100 MCG/2ML IJ SOLN
INTRAMUSCULAR | Status: AC
  Filled 2014-11-12: qty 2

## 2014-11-12 MED ORDER — ONDANSETRON HCL 4 MG PO TABS: 4.0000 mg | ORAL_TABLET | Freq: Four times a day (QID) | ORAL | Status: DC | PRN

## 2014-11-12 MED ORDER — GLYCOPYRROLATE 0.2 MG/ML IJ SOLN
INTRAMUSCULAR | Status: DC | PRN
Start: 2014-11-12 — End: 2014-11-12
  Administered 2014-11-12: .8 mg via INTRAVENOUS

## 2014-11-12 MED ORDER — DOCUSATE SODIUM 100 MG PO CAPS
100.0000 mg | ORAL_CAPSULE | Freq: Two times a day (BID) | ORAL | Status: DC
  Administered 2014-11-12 – 2014-11-14 (×4): 100 mg via ORAL

## 2014-11-12 MED ORDER — FENTANYL CITRATE (PF) 100 MCG/2ML IJ SOLN
INTRAMUSCULAR | Status: AC
  Filled 2014-11-12: qty 4

## 2014-11-12 MED ORDER — KETOROLAC TROMETHAMINE 15 MG/ML IJ SOLN
7.5000 mg | Freq: Four times a day (QID) | INTRAMUSCULAR | Status: AC
  Administered 2014-11-12 – 2014-11-13 (×3): 7.5 mg via INTRAVENOUS
  Filled 2014-11-12 (×2): qty 1

## 2014-11-12 MED ORDER — ALUM & MAG HYDROXIDE-SIMETH 200-200-20 MG/5ML PO SUSP: 30.0000 mL | ORAL | Status: DC | PRN

## 2014-11-12 MED ORDER — METHOCARBAMOL 1000 MG/10ML IJ SOLN
500.0000 mg | Freq: Four times a day (QID) | INTRAVENOUS | Status: DC | PRN
  Administered 2014-11-12: 500 mg via INTRAVENOUS
  Filled 2014-11-12 (×2): qty 5

## 2014-11-12 MED ORDER — ACETAMINOPHEN 650 MG RE SUPP: 650.0000 mg | Freq: Four times a day (QID) | RECTAL | Status: DC | PRN

## 2014-11-12 MED ORDER — NEOSTIGMINE METHYLSULFATE 10 MG/10ML IV SOLN
INTRAVENOUS | Status: DC | PRN
  Administered 2014-11-12: 5 mg via INTRAVENOUS

## 2014-11-12 MED ORDER — OXYCODONE HCL 5 MG PO TABS
5.0000 mg | ORAL_TABLET | ORAL | Status: DC | PRN
  Administered 2014-11-12 (×2): 10 mg via ORAL
  Administered 2014-11-13 – 2014-11-14 (×6): 15 mg via ORAL
  Filled 2014-11-12 (×2): qty 3
  Filled 2014-11-12 (×2): qty 2
  Filled 2014-11-12 (×4): qty 3

## 2014-11-12 MED ORDER — FENTANYL CITRATE (PF) 100 MCG/2ML IJ SOLN
25.0000 ug | INTRAMUSCULAR | Status: DC | PRN
  Administered 2014-11-12 (×3): 50 ug via INTRAVENOUS

## 2014-11-12 MED ORDER — METOCLOPRAMIDE HCL 10 MG PO TABS: 5.0000 mg | ORAL_TABLET | Freq: Three times a day (TID) | ORAL | Status: DC | PRN

## 2014-11-12 MED ORDER — METHOCARBAMOL 500 MG PO TABS
500.0000 mg | ORAL_TABLET | Freq: Four times a day (QID) | ORAL | Status: DC | PRN
  Administered 2014-11-12 – 2014-11-13 (×2): 500 mg via ORAL
  Filled 2014-11-12 (×2): qty 1

## 2014-11-12 MED ORDER — MENTHOL 3 MG MT LOZG: 1.0000 | LOZENGE | OROMUCOSAL | Status: DC | PRN

## 2014-11-12 SURGICAL SUPPLY — 43 items
BAG ZIPLOCK 12X15 (MISCELLANEOUS) IMPLANT
BENZOIN TINCTURE PRP APPL 2/3 (GAUZE/BANDAGES/DRESSINGS) IMPLANT
BLADE SAW SGTL 18X1.27X75 (BLADE) ×2 IMPLANT
BLADE SAW SGTL 18X1.27X75MM (BLADE) ×1
CAPT HIP TOTAL 2 ×3 IMPLANT
CELLS DAT CNTRL 66122 CELL SVR (MISCELLANEOUS) ×1 IMPLANT
CLOSURE WOUND 1/2 X4 (GAUZE/BANDAGES/DRESSINGS)
COVER PERINEAL POST (MISCELLANEOUS) ×3 IMPLANT
DRAPE C-ARM 42X120 X-RAY (DRAPES) ×3 IMPLANT
DRAPE STERI IOBAN 125X83 (DRAPES) ×3 IMPLANT
DRAPE U-SHAPE 47X51 STRL (DRAPES) ×9 IMPLANT
DRSG AQUACEL AG ADV 3.5X10 (GAUZE/BANDAGES/DRESSINGS) ×3 IMPLANT
DURAPREP 26ML APPLICATOR (WOUND CARE) ×3 IMPLANT
ELECT BLADE TIP CTD 4 INCH (ELECTRODE) ×3 IMPLANT
ELECT REM PT RETURN 9FT ADLT (ELECTROSURGICAL) ×3
ELECTRODE REM PT RTRN 9FT ADLT (ELECTROSURGICAL) ×1 IMPLANT
FACESHIELD WRAPAROUND (MASK) ×12 IMPLANT
GAUZE XEROFORM 1X8 LF (GAUZE/BANDAGES/DRESSINGS) ×3 IMPLANT
GLOVE BIO SURGEON STRL SZ7.5 (GLOVE) ×3 IMPLANT
GLOVE BIOGEL PI IND STRL 8 (GLOVE) ×2 IMPLANT
GLOVE BIOGEL PI INDICATOR 8 (GLOVE) ×4
GLOVE ECLIPSE 8.0 STRL XLNG CF (GLOVE) ×3 IMPLANT
GOWN STRL REUS W/TWL XL LVL3 (GOWN DISPOSABLE) ×6 IMPLANT
HANDPIECE INTERPULSE COAX TIP (DISPOSABLE) ×2
KIT BASIN OR (CUSTOM PROCEDURE TRAY) ×3 IMPLANT
PACK TOTAL JOINT (CUSTOM PROCEDURE TRAY) ×3 IMPLANT
PEN SKIN MARKING BROAD (MISCELLANEOUS) ×3 IMPLANT
RTRCTR WOUND ALEXIS 18CM MED (MISCELLANEOUS) ×3
SET HNDPC FAN SPRY TIP SCT (DISPOSABLE) ×1 IMPLANT
SPONGE LAP 18X18 X RAY DECT (DISPOSABLE) ×3 IMPLANT
STAPLER VISISTAT 35W (STAPLE) IMPLANT
STRIP CLOSURE SKIN 1/2X4 (GAUZE/BANDAGES/DRESSINGS) IMPLANT
SUT ETHIBOND NAB CT1 #1 30IN (SUTURE) ×3 IMPLANT
SUT MNCRL AB 4-0 PS2 18 (SUTURE) IMPLANT
SUT VIC AB 0 CT1 36 (SUTURE) ×3 IMPLANT
SUT VIC AB 1 CT1 36 (SUTURE) ×3 IMPLANT
SUT VIC AB 2-0 CT1 27 (SUTURE) ×4
SUT VIC AB 2-0 CT1 TAPERPNT 27 (SUTURE) ×2 IMPLANT
TOWEL OR 17X26 10 PK STRL BLUE (TOWEL DISPOSABLE) ×3 IMPLANT
TOWEL OR NON WOVEN STRL DISP B (DISPOSABLE) ×3 IMPLANT
TRAY FOLEY W/METER SILVER 14FR (SET/KITS/TRAYS/PACK) IMPLANT
TRAY FOLEY W/METER SILVER 16FR (SET/KITS/TRAYS/PACK) ×3 IMPLANT
YANKAUER SUCT BULB TIP 10FT TU (MISCELLANEOUS) ×3 IMPLANT

## 2014-11-12 NOTE — Plan of Care (Signed)
Problem: Consults Goal: Diagnosis- Total Joint Replacement Right anterior hip     

## 2014-11-12 NOTE — Anesthesia Preprocedure Evaluation (Signed)
Anesthesia Evaluation  Patient identified by MRN, date of birth, ID band Patient awake    Reviewed: Allergy & Precautions, NPO status   History of Anesthesia Complications Negative for: history of anesthetic complications  Airway Mallampati: I  TM Distance: >3 FB Neck ROM: Full    Dental  (+) Poor Dentition   Pulmonary sleep apnea , Current Smoker,  breath sounds clear to auscultation        Cardiovascular hypertension, +CHF Rhythm:Regular Rate:Normal  ECHO noted from 2015, preserved LV function   Neuro/Psych    GI/Hepatic   Endo/Other  Morbid obesity  Renal/GU Renal InsufficiencyRenal disease     Musculoskeletal   Abdominal (+) + obese,   Peds  Hematology   Anesthesia Other Findings   Reproductive/Obstetrics                             Anesthesia Physical  Anesthesia Plan  ASA: III  Anesthesia Plan: General   Post-op Pain Management:    Induction: Intravenous  Airway Management Planned: Oral ETT  Additional Equipment:   Intra-op Plan:   Post-operative Plan: Extubation in OR  Informed Consent: I have reviewed the patients History and Physical, chart, labs and discussed the procedure including the risks, benefits and alternatives for the proposed anesthesia with the patient or authorized representative who has indicated his/her understanding and acceptance.     Plan Discussed with: CRNA and Surgeon  Anesthesia Plan Comments:         Anesthesia Quick Evaluation

## 2014-11-12 NOTE — Anesthesia Procedure Notes (Signed)
Procedure Name: Intubation Performed by: Gean Maidens Pre-anesthesia Checklist: Patient identified, Emergency Drugs available, Suction available, Patient being monitored and Timeout performed Patient Re-evaluated:Patient Re-evaluated prior to inductionOxygen Delivery Method: Circle system utilized Preoxygenation: Pre-oxygenation with 100% oxygen Intubation Type: IV induction Ventilation: Mask ventilation without difficulty Laryngoscope Size: Mac and 4 Grade View: Grade II Tube type: Oral Tube size: 7.5 mm Number of attempts: 2 Airway Equipment and Method: Bougie stylet Placement Confirmation: positive ETCO2,  CO2 detector and breath sounds checked- equal and bilateral Secured at: 22 cm Tube secured with: Tape Dental Injury: Teeth and Oropharynx as per pre-operative assessment  Difficulty Due To: Difficult Airway- due to reduced neck mobility Comments: ETT placed by MDA on 2nd DL with bougie.

## 2014-11-12 NOTE — Brief Op Note (Signed)
11/12/2014  11:50 AM  PATIENT:  [name] [name]  59 y.o. male  PRE-OPERATIVE DIAGNOSIS:  severe osteoarthritis right hip  POST-OPERATIVE DIAGNOSIS:  severe osteoarthritis right hip  PROCEDURE:  Procedure(s): RIGHT TOTAL HIP ARTHROPLASTY ANTERIOR APPROACH (Right)  SURGEON:  Surgeon(s) and Role:    * [name], MD - Primary  PHYSICIAN ASSISTANT: Benita Stabile, PA-C  ANESTHESIA:   general  EBL:  Total I/O In: -  Out: 650 [Urine:150; Blood:500]  BLOOD ADMINISTERED:none  DRAINS: none   LOCAL MEDICATIONS USED:  NONE  SPECIMEN:  No Specimen  DISPOSITION OF SPECIMEN:  N/A  COUNTS:  YES  TOURNIQUET:  * No tourniquets in log *  DICTATION: .Other Dictation: Dictation Number [id number]  PLAN OF CARE: Admit to inpatient   PATIENT DISPOSITION:  PACU - hemodynamically stable.   Delay start of Pharmacological VTE agent (>24hrs) due to surgical blood loss or risk of bleeding: no

## 2014-11-12 NOTE — Anesthesia Postprocedure Evaluation (Signed)
  Anesthesia Post-op Note  Patient: [name] [name]  Procedure(s) Performed: Procedure(s) (LRB): RIGHT TOTAL HIP ARTHROPLASTY ANTERIOR APPROACH (Right)  Patient Location: PACU  Anesthesia Type: General  Level of Consciousness: awake and alert   Airway and Oxygen Therapy: Patient Spontanous Breathing  Post-op Pain: mild  Post-op Assessment: Post-op Vital signs reviewed, Patient's Cardiovascular Status Stable, Respiratory Function Stable, Patent Airway and No signs of Nausea or vomiting  Last Vitals:  Filed Vitals:   11/12/14 1640  BP: 88/52  Pulse: 70  Temp: 36.8 C  Resp: 16    Post-op Vital Signs: stable   Complications: No apparent anesthesia complications

## 2014-11-12 NOTE — Transfer of Care (Signed)
Immediate Anesthesia Transfer of Care Note  Patient: [name] [name]  Procedure(s) Performed: Procedure(s): RIGHT TOTAL HIP ARTHROPLASTY ANTERIOR APPROACH (Right)  Patient Location: PACU  Anesthesia Type:General  Level of Consciousness: awake, alert  and oriented  Airway & Oxygen Therapy: Patient Spontanous Breathing and Patient connected to face mask oxygen  Post-op Assessment: Report given to RN and Post -op Vital signs reviewed and stable  Post vital signs: Reviewed and stable  Last Vitals:  Filed Vitals:   11/12/14 0801  BP: 123/58  Pulse: 74  Temp: 36.5 C  Resp: 18    Complications: No apparent anesthesia complications

## 2014-11-12 NOTE — H&P (Signed)
TOTAL HIP ADMISSION H&P  Patient is admitted for right total hip arthroplasty.  Subjective:  Chief Complaint: right hip pain  HPI: [name] [name], 59 y.o. male, has a history of pain and functional disability in the right hip(s) due to arthritis and patient has failed non-surgical conservative treatments for greater than 12 weeks to include NSAID's and/or analgesics, flexibility and strengthening excercises, supervised PT with diminished ADL's post treatment, use of assistive devices, weight reduction as appropriate and activity modification.  Onset of symptoms was gradual starting 5 years ago with gradually worsening course since that time.The patient noted no past surgery on the right hip(s).  Patient currently rates pain in the right hip at 10 out of 10 with activity. Patient has night pain, worsening of pain with activity and weight bearing, trendelenberg gait, pain that interfers with activities of daily living, pain with passive range of motion and crepitus. Patient has evidence of subchondral cysts, subchondral sclerosis, periarticular osteophytes and joint space narrowing by imaging studies. This condition presents safety issues increasing the risk of falls.  There is no current active infection.  Patient Active Problem List   Diagnosis Date Noted  . Osteoarthritis of right hip 11/12/2014  . Osteoarthritis of left hip 08/27/2014  . Status post total replacement of left hip 08/27/2014  . Hip pain 11/19/2011  . Obstructive sleep apnea 11/22/2010  . OBESITY-MORBID (>100') 12/16/2008  . HYPERTENSION, UNSPECIFIED 12/16/2008  . 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 Morbid obesity   . HTN (hypertension)   . Chronic systolic heart failure   . Cardiomyopathy     secondary   . Coronary artery disease   . CHF (congestive heart failure)   . Arthritis   . Cancer 2007    colon cancer  . OSA (obstructive sleep apnea)     does not use CPAP     Past Surgical History  Procedure Laterality Date  . Colon surgery  2007    surgery for colon cancer  . Total hip arthroplasty Left 08/27/2014    Procedure: LEFT TOTAL HIP ARTHROPLASTY ANTERIOR APPROACH;  Surgeon: [name], MD;  Location: [facility] ORS;  Service: Orthopedics;  Laterality: Left;    No prescriptions prior to admission   Allergies  Allergen Reactions  . Penicillins     REACTION: rash    History  Substance Use Topics  . Smoking status: Current Every Day Smoker -- 0.50 packs/day for 40 years    Types: Cigarettes    Start date: 08/01/1974  . Smokeless tobacco: Never Used  . Alcohol Use: No    No family history on file.   Review of Systems  Musculoskeletal: Positive for joint pain.  All other systems reviewed and are negative.   Objective:  Physical Exam  Constitutional: He is oriented to person, place, and time. He appears well-developed and well-nourished.  HENT:  Head: Normocephalic and atraumatic.  Eyes: EOM are normal. Pupils are equal, round, and reactive to light.  Neck: Normal range of motion. Neck supple.  Cardiovascular: Normal rate and regular rhythm.   Respiratory: Effort normal and breath sounds normal.  GI: Soft. Bowel sounds are normal.  Musculoskeletal:       Right hip: He exhibits decreased range of motion, decreased strength, tenderness, bony tenderness and crepitus.  Neurological: He is alert and oriented to person, place, and time.  Skin: Skin is warm and dry.  Psychiatric: He has a normal mood and affect.  Vital signs in last 24 hours:    Labs:   Estimated body mass index is 40.10 kg/(m^2) as calculated from the following:   Height as of 08/27/14: 5\' 5"  (1.651 m).   Weight as of 08/23/14: 109.317 kg (241 lb).   Imaging Review Plain radiographs demonstrate severe degenerative joint disease of the right hip(s). The bone quality appears to be good for age and reported activity level.  Assessment/Plan:  End stage  arthritis, right hip(s)  The patient history, physical examination, clinical judgement of the provider and imaging studies are consistent with end stage degenerative joint disease of the right hip(s) and total hip arthroplasty is deemed medically necessary. The treatment options including medical management, injection therapy, arthroscopy and arthroplasty were discussed at length. The risks and benefits of total hip arthroplasty were presented and reviewed. The risks due to aseptic loosening, infection, stiffness, dislocation/subluxation,  thromboembolic complications and other imponderables were discussed.  The patient acknowledged the explanation, agreed to proceed with the plan and consent was signed. Patient is being admitted for inpatient treatment for surgery, pain control, PT, OT, prophylactic antibiotics, VTE prophylaxis, progressive ambulation and ADL's and discharge planning.The patient is planning to be discharged home with home health services

## 2014-11-13 LAB — BASIC METABOLIC PANEL
Anion gap: 5 (ref 5–15)
BUN: 42 mg/dL — ABNORMAL HIGH (ref 6–20)
CO2: 25 mmol/L (ref 22–32)
Calcium: 7.9 mg/dL — ABNORMAL LOW (ref 8.9–10.3)
Chloride: 108 mmol/L (ref 101–111)
Creatinine, Ser: 1.9 mg/dL — ABNORMAL HIGH (ref 0.61–1.24)
GFR calc Af Amer: 42 mL/min — ABNORMAL LOW (ref >60)
GFR calc non Af Amer: 36 mL/min — ABNORMAL LOW (ref >60)
Glucose, Bld: 133 mg/dL — ABNORMAL HIGH (ref 65–99)
Potassium: 4.7 mmol/L (ref 3.5–5.1)
Sodium: 138 mmol/L (ref 135–145)

## 2014-11-13 LAB — CBC
HCT: 32.6 % — ABNORMAL LOW (ref 39.0–52.0)
Hemoglobin: 10.1 g/dL — ABNORMAL LOW (ref 13.0–17.0)
MCH: 28.9 pg (ref 26.0–34.0)
MCHC: 31 g/dL (ref 30.0–36.0)
MCV: 93.1 fL (ref 78.0–100.0)
Platelets: 175 10*3/uL (ref 150–400)
RBC: 3.5 MIL/uL — ABNORMAL LOW (ref 4.22–5.81)
RDW: 13.2 % (ref 11.5–15.5)
WBC: 7.5 10*3/uL (ref 4.0–10.5)

## 2014-11-13 MED ORDER — SODIUM CHLORIDE 0.9 % IV BOLUS (SEPSIS)
300.0000 mL | Freq: Once | INTRAVENOUS | Status: AC
  Administered 2014-11-13: 300 mL via INTRAVENOUS

## 2014-11-13 MED ORDER — TIZANIDINE HCL 4 MG PO TABS
4.0000 mg | ORAL_TABLET | Freq: Four times a day (QID) | ORAL | Status: DC | PRN
Start: 2014-11-13 — End: 2017-03-05

## 2014-11-13 MED ORDER — OXYCODONE HCL 5 MG PO TABS: 5.0000 mg | ORAL_TABLET | ORAL | Status: DC | PRN

## 2014-11-13 MED ORDER — ASPIRIN 325 MG PO TBEC: 325.0000 mg | DELAYED_RELEASE_TABLET | Freq: Two times a day (BID) | ORAL | Status: DC

## 2014-11-13 NOTE — Discharge Instructions (Signed)

## 2014-11-13 NOTE — Progress Notes (Signed)
Physical Therapy Treatment Patient Details Name: [name] [name] MRN: [id number] DOB: Dec 27, 1955 Today's Date: Dec 08, 2014    History of Present Illness R THR - s/p L THR 5/16    PT Comments    Steady progress with mobility.  Pt hopeful for dc tomorrow.  Follow Up Recommendations  Home health PT     Equipment Recommendations  None recommended by PT    Recommendations for Other Services OT consult     Precautions / Restrictions Precautions Precautions: Fall Restrictions Weight Bearing Restrictions: No    Mobility  Bed Mobility Overal bed mobility: Needs Assistance Bed Mobility: Sit to Supine       Sit to supine: Min assist   General bed mobility comments: cues for sequence and use of L LE to self assist  Transfers Overall transfer level: Needs assistance Equipment used: Rolling walker (2 wheeled) Transfers: Sit to/from Stand Sit to Stand: Min assist         General transfer comment: cues for LE management and use of UEs to self assist  Ambulation/Gait Ambulation/Gait assistance: Min assist;Min guard Ambulation Distance (Feet): 80 Feet Assistive device: Rolling walker (2 wheeled) Gait Pattern/deviations: Step-to pattern;Shuffle;Trunk flexed Gait velocity: decr   General Gait Details: cues for sequence, posture and position from [employer]            Wheelchair Mobility    Modified Rankin (Stroke Patients Only)       Balance                                    Cognition Arousal/Alertness: Awake/alert Behavior During Therapy: WFL for tasks assessed/performed Overall Cognitive Status: Within Functional Limits for tasks assessed                      Exercises      General Comments        Pertinent Vitals/Pain Pain Assessment: 0-10 Pain Score: 3  Pain Location: R hip and back Pain Descriptors / Indicators: Aching;Sore Pain Intervention(s): Limited activity within patient's tolerance;Monitored during  session;Premedicated before session;Ice applied    Home Living                      Prior Function            PT Goals (current goals can now be found in the care plan section) Acute Rehab PT Goals Patient Stated Goal: Do as well with this hip as the last one PT Goal Formulation: With patient Time For Goal Achievement: 11/23/14 Potential to Achieve Goals: Good Progress towards PT goals: Progressing toward goals    Frequency  7X/week    PT Plan Current plan remains appropriate    Co-evaluation             End of Session   Activity Tolerance: Patient tolerated treatment well Patient left: in bed;with call bell/phone within reach     Time: 1632-1700 PT Time Calculation (min) (ACUTE ONLY): 28 min  Charges:  $Gait Training: 23-37 mins                    G Codes:      [name] [name] Dec 08, 2014, 5:22 PM

## 2014-11-13 NOTE — Care Management Note (Signed)
Case Management Note  Patient Details  Name: [name] [name] MRN: [id number] Date of Birth: 1956/01/11  Subjective/Objective:                   Right Total Hip Replacement  Action/Plan: Home health services  Expected Discharge Date:    11/14/14              Expected Discharge Plan:   Home w/ Home Health Services  In-House Referral:     Discharge planning Services  CM Consult  Post Acute Care Choice:  NA Choice offered to:     DME Arranged:  N/A DME Agency:  NA  HH Arranged:  PT HH Agency:  [facility]  Status of Service:  Completed, signed off  Medicare Important Message Given:    Date Medicare IM Given:    Medicare IM give by:    Date Additional Medicare IM Given:    Additional Medicare Important Message give by:     If discussed at [place] of Stay Meetings, dates discussed:    Additional Comments: CM spoke with patient at the bedside. [name] was set-up preoperatively, patient agrees. Has a 3N1 and RW at home. His daughters will assist him at home.   Apolonio Schneiders, RN 11/13/2014, 11:32 AM

## 2014-11-13 NOTE — Clinical Documentation Improvement (Signed)
Anesthesia documented history of renal insufficiency / renal disease in pre-op assessment.  Renal labs below.  Please document in your future progress notes if this can be further specified.  Component BUN Creatinine  Latest Ref Rng 6 - 20 mg/dL 0.61 - 1.24 mg/dL  11/13/2014 42 (H) 1.90 (H)   Component EGFR (African American)  Latest Ref Rng >60 mL/min  11/13/2014 42 (L)   Possible Clinical Conditions: -Chronic Kidney Disease (if present, please specify stage) -Acute Kidney Injury on Chronic Kidney Disease (please specify stage of CKD) -Renal Insufficiency only as currently documented -Other condition  -Unable to determine at present  Thank you, Mateo Flow, RN [phone] Clinical Documentation Specialist

## 2014-11-13 NOTE — Progress Notes (Signed)
Subjective: 1 Day Post-Op Procedure(s) (LRB): RIGHT TOTAL HIP ARTHROPLASTY ANTERIOR APPROACH (Right) Patient reports pain as moderate.  Has experience with total hips having just had his other hip replaced in May of this year.  Asymptomatic acute blood loss anemai.  Objective: Vital signs in last 24 hours: Temp:  [97.5 F (36.4 C)-99.1 F (37.3 C)] 99.1 F (37.3 C) (08/06 0536) Pulse Rate:  [62-97] 75 (08/06 0536) Resp:  [10-20] 18 (08/06 0536) BP: (88-127)/(40-85) 93/57 mmHg (08/06 0536) SpO2:  [94 %-100 %] 100 % (08/06 0536) Weight:  [108.863 kg (240 lb)] 108.863 kg (240 lb) (08/05 1340)  Intake/Output from previous day: 08/05 0701 - 08/06 0700 In: 4777.5 [P.O.:2040; I.V.:2687.5; IV Piggyback:50] Out: 1300 [Urine:800; Blood:500] Intake/Output this shift:     Recent Labs  11/13/14 0446  HGB 10.1*    Recent Labs  11/13/14 0446  WBC 7.5  RBC 3.50*  HCT 32.6*  PLT 175    Recent Labs  11/13/14 0446  NA 138  K 4.7  CL 108  CO2 25  BUN 42*  CREATININE 1.90*  GLUCOSE 133*  CALCIUM 7.9*   No results for input(s): LABPT, INR in the last 72 hours.  Sensation intact distally Intact pulses distally Dorsiflexion/Plantar flexion intact Incision: scant drainage  Assessment/Plan: 1 Day Post-Op Procedure(s) (LRB): RIGHT TOTAL HIP ARTHROPLASTY ANTERIOR APPROACH (Right) Up with therapy. Probably discharge to home tomorrow.  Ayomide Purdy Y 11/13/2014, 7:21 AM

## 2014-11-13 NOTE — Progress Notes (Signed)
Pt's BP low. Notified Dr Lorin Mercy, order received. [name] [name], [employer]

## 2014-11-13 NOTE — Progress Notes (Signed)
OT Cancellation Note  Patient Details Name: [name] [name] MRN: [id number] DOB: [birth year]   Cancelled Treatment:    Reason Eval/Treat Not Completed: Other (comment).  Pt with low BP.  Will check back in am.  Jya Hughston 11/13/2014, 12:10 PM  Lesle Chris, OTR/L [phone] 11/13/2014

## 2014-11-13 NOTE — Op Note (Signed)
NAMELAVONTE, [name] [name]            ACCOUNT NO.:  [id number]  MEDICAL RECORD NO.:  [id number]  LOCATION:  7                         FACILITY:  [facility]  PHYSICIAN:  Lind Guest. Ninfa Linden, M.D.DATE OF BIRTH:  20-Feb-1956  DATE OF PROCEDURE:  11/12/2014 DATE OF DISCHARGE:                              OPERATIVE REPORT   PREOPERATIVE DIAGNOSIS:  Primary osteoarthritis, degenerative joint disease, right hip.  POSTOPERATIVE DIAGNOSIS:  Primary osteoarthritis, degenerative joint disease, right hip.  PROCEDURE:  Right total hip arthroplasty through direct anterior approach.  IMPLANTS:  DePuy Sector Gription acetabular component size 54, and a single screw, size 36+ 4 neutral polyethylene liner, size 11 Corail femoral component with standard offset, size 36+ 1.5 ceramic hip ball.  SURGEON:  Lind Guest. Ninfa Linden, M.D.  ASSISTANT:  Erskine Emery, PA-C  ANESTHESIA:  General.  BLOOD LOSS:  500 mL to 800 mL.  ANTIBIOTICS:  900 mg of IV clindamycin.  COMPLICATIONS:  None.  INDICATIONS:  [name] [name] is 59 year old gentleman, well known to me. He has bilateral hip osteoarthritis, it was quite severe.  He underwent a successful left total hip arthroplasty since May of this year.  His right hip shows complete loss of the joint space, basically his femoral head is melting away, almost has no rotation of his hip, and radiographs are impressive for amount of arthritis in his hip.  With the success of his left total hip arthroplasty, he wished to proceed with a total hip arthroplasty on the right side at this point.  His mobility greatly limited his quality of life and activities of daily living have been detrimentally impacted because of his hip.  He understands the risks of acute blood loss, anemia, nerve and vessel injury, fracture, infection, dislocation, DVT.  PROCEDURE DESCRIPTION:  After informed consent was obtained, the right hip was marked.  He was brought to the operating  room and general anesthesia was obtained while he was on the stretcher.  Traction boots were placed on both his feet.  Next, he was placed supine on the Hana fracture table with perineal post in place and both legs in inline skeletal traction devices, no traction applied.  His right operative hip was then prepped and draped with DuraPrep and sterile drapes.  A time- out was called to identify correct patient, correct right hip.  I then made an incision inferior and posterior to the anterior superior iliac spine and carried this obliquely down the leg.  We dissected down the tensor fascia lata muscle, extensor fascia was then divided longitudinally, so we could proceed with a direct anterior approach to the hip.  We found his hip capsule quite scarred, identified and cauterized the lateral femoral circumflex vessels in an attempt to find the femoral neck.  We were able to open up the hip capsule.  We had to remove a lot of hip capsule because of the scarring of his hip.  His hip was so stiff, we were able to barely rotate it.  We then used an oscillating saw to make our femoral neck cut proximal to the lesser trochanter and completed this with an osteotome.  We had to remove the femoral head piecemeal because it was just basically degenerated  away. We then cleaned the acetabular soft tissue and remnants of acetabular labrum and debris.  We placed a bent Hohmann over the medial acetabular rim, then began reaming under direct visualization and fluoroscopy from a size 42 reamer up to a size 54, this corresponded with his other side. We then placed the real DePuy Sector Gription acetabular component size 54, a single screw, and a 36+ 4 neutral polyethylene liner.  Attention was then turned to the femur with the leg externally rotated to 100 degrees, extended and adducted.  We were able to place a Mueller retractor medially and a Hohmann retractor behind the greater trochanter.  We released  remnants of the lateral joint capsule in the piriformis, used a rongeur to lateralize and a box cutting osteotome to enter femoral canal.  We then broached from a size 8, broached up to a size 11.  With the size 11 in place, it was a nice tighter fit than his other side.  So we were able to trial 36+ 1.5 hip ball, we brought the leg back over and up in traction and internal rotation reducing the pelvis.  We were pleased with stability, leg length, and offset.  We then dislocated the hip and removed the trial components.  We placed the real Corail femoral component size 11 standard offset and the real 36+ 1.5 ceramic hip ball.  We reduced this back in the acetabulum and it was stable.  I then copiously irrigated the soft tissues with normal saline solution using pulsatile lavage.  I closed the joint capsule remnants with interrupted #1 Ethibond suture followed by running #1 Vicryl in the tensor fascia, 0 Vicryl in the deep tissue, 2-0 Vicryl in subcutaneous tissue, with staples on the skin.  Xeroform and Aquacel dressing was applied.  He was then taken off the Hana table, awakened, extubated, and taken to recovery room in stable condition.  All final counts were correct.  No complications noted.  Of note, Erskine Emery, PA-C, assisted in the entire case.  His assistance was crucial for every single aspect of this case.     Lind Guest. Ninfa Linden, M.D.     CYB/MEDQ  D:  11/12/2014  T:  11/13/2014  Job:  [id number]

## 2014-11-13 NOTE — Progress Notes (Signed)
BP remains low. Dr Lorin Mercy notified & order received. Jimi Schappert, [employer]

## 2014-11-13 NOTE — Progress Notes (Signed)
Physical Therapy Evaluation Patient Details Name: [name] [name] MRN: [id number] DOB: 10/18/1955 Today's Date: 11/13/2014   History of Present Illness  R THR - s/p L THR 5/16  Clinical Impression  Pt s/p R THR presents with decreased R LE strength and post op pain limiting functional mobility.  Pt should progress to dc home with family assist and HHPT follow up    Follow Up Recommendations Home health PT    Equipment Recommendations  None recommended by PT    Recommendations for Other Services OT consult     Precautions / Restrictions Precautions Precautions: Fall Restrictions Weight Bearing Restrictions: No      Mobility  Bed Mobility Overal bed mobility: Needs Assistance Bed Mobility: Supine to Sit     Supine to sit: Min guard     General bed mobility comments: cues for sequence and use of UEs to self assist  Transfers Overall transfer level: Needs assistance Equipment used: Rolling walker (2 wheeled) Transfers: Sit to/from Stand Sit to Stand: Min assist         General transfer comment: cues for LE management and use of UEs to self assist  Ambulation/Gait Ambulation/Gait assistance: Min assist Ambulation Distance (Feet): 74 Feet Assistive device: Rolling walker (2 wheeled) Gait Pattern/deviations: Step-to pattern;Decreased step length - right;Decreased step length - left;Shuffle;Trunk flexed     General Gait Details: cues for sequence, posture and position from [employer]            Wheelchair Mobility    Modified Rankin (Stroke Patients Only)       Balance                                             Pertinent Vitals/Pain Pain Assessment: 0-10 Pain Score: 5  Pain Location: R hip and back Pain Descriptors / Indicators: Aching;Sore Pain Intervention(s): Limited activity within patient's tolerance;Monitored during session;Premedicated before session;Ice applied    Home Living Family/patient expects to be  discharged to:: Private residence Living Arrangements: Alone Available Help at Discharge: Friend(s);Available PRN/intermittently;Family Type of Home: Apartment Home Access: Stairs to enter Entrance Stairs-Rails: Right Entrance Stairs-Number of Steps: 2 Home Layout: One level Home Equipment: Walker - 2 wheels      Prior Function Level of Independence: Independent;Independent with assistive device(s)               Hand Dominance        Extremity/Trunk Assessment   Upper Extremity Assessment: Overall WFL for tasks assessed           Lower Extremity Assessment: RLE deficits/detail RLE Deficits / Details: 2+/5 hip strength with AAROM at hip to 80 flex and 15 abd    Cervical / Trunk Assessment: Normal  Communication   Communication: No difficulties  Cognition Arousal/Alertness: Awake/alert Behavior During Therapy: WFL for tasks assessed/performed Overall Cognitive Status: Within Functional Limits for tasks assessed                      General Comments      Exercises Total Joint Exercises Ankle Circles/Pumps: AROM;Both;15 reps;Supine Quad Sets: AROM;Both;10 reps;Supine Heel Slides: AAROM;20 reps;Supine;Right Hip ABduction/ADduction: AAROM;Right;15 reps;Supine      Assessment/Plan    PT Assessment Patient needs continued PT services  PT Diagnosis Difficulty walking   PT Problem List Decreased strength;Decreased range of motion;Decreased activity tolerance;Decreased mobility;Decreased knowledge of use  of DME;Obesity;Pain  PT Treatment Interventions DME instruction;Gait training;Stair training;Functional mobility training;Therapeutic activities;Therapeutic exercise;Patient/family education   PT Goals (Current goals can be found in the Care Plan section) Acute Rehab PT Goals Patient Stated Goal: Do as well with this hip as the last one PT Goal Formulation: With patient Time For Goal Achievement: 11/23/14 Potential to Achieve Goals: Good     Frequency 7X/week   Barriers to discharge        Co-evaluation               End of Session Equipment Utilized During Treatment: Gait belt Activity Tolerance: Patient tolerated treatment well Patient left: in chair Nurse Communication: Mobility status         Time: 4403-4742 PT Time Calculation (min) (ACUTE ONLY): 35 min   Charges:   PT Evaluation $Initial PT Evaluation Tier I: 1 Procedure PT Treatments $Therapeutic Exercise: 8-22 mins   PT G Codes:        [name] [name] 03-Dec-2014, 1:16 PM

## 2014-11-14 LAB — CBC
HCT: 30.1 % — ABNORMAL LOW (ref 39.0–52.0)
Hemoglobin: 9.7 g/dL — ABNORMAL LOW (ref 13.0–17.0)
MCH: 30.2 pg (ref 26.0–34.0)
MCHC: 32.2 g/dL (ref 30.0–36.0)
MCV: 93.8 fL (ref 78.0–100.0)
Platelets: 178 10*3/uL (ref 150–400)
RBC: 3.21 MIL/uL — ABNORMAL LOW (ref 4.22–5.81)
RDW: 13.2 % (ref 11.5–15.5)
WBC: 9.1 10*3/uL (ref 4.0–10.5)

## 2014-11-14 NOTE — Progress Notes (Signed)
Discharged from floor via w/c, belongings & daughter with pt. No changes in assessment. Banita Lehn, [employer]

## 2014-11-14 NOTE — Progress Notes (Signed)
Subjective: 2 Days Post-Op Procedure(s) (LRB): RIGHT TOTAL HIP ARTHROPLASTY ANTERIOR APPROACH (Right) Patient reports pain as mild.    Objective: Vital signs in last 24 hours: Temp:  [97.3 F (36.3 C)-99.7 F (37.6 C)] 99.3 F (37.4 C) (08/07 0520) Pulse Rate:  [61-101] 92 (08/07 1114) Resp:  [16-20] 20 (08/07 0520) BP: (90-120)/(44-66) 101/44 mmHg (08/07 0935) SpO2:  [82 %-100 %] 89 % (08/07 1114)  Intake/Output from previous day: 08/06 0701 - 08/07 0700 In: 2880 [P.O.:600; I.V.:1980; IV Piggyback:300] Out: 1050 [Urine:1050] Intake/Output this shift: Total I/O In: 240 [P.O.:240] Out: -    Recent Labs  11/13/14 0446 11/14/14 0503  HGB 10.1* 9.7*    Recent Labs  11/13/14 0446 11/14/14 0503  WBC 7.5 9.1  RBC 3.50* 3.21*  HCT 32.6* 30.1*  PLT 175 178    Recent Labs  11/13/14 0446  NA 138  K 4.7  CL 108  CO2 25  BUN 42*  CREATININE 1.90*  GLUCOSE 133*  CALCIUM 7.9*   No results for input(s): LABPT, INR in the last 72 hours.  Neurologically intact  Assessment/Plan: 2 Days Post-Op Procedure(s) (LRB): RIGHT TOTAL HIP ARTHROPLASTY ANTERIOR APPROACH (Right) Discharge home with home health  Needs to quit smoking sats in high 80's off O2.  Discussed in detail.   YATES,MARK C 11/14/2014, 11:46 AM

## 2014-11-14 NOTE — Progress Notes (Signed)
Occupational Therapy Evaluation Patient Details Name: [name] [name] MRN: [id number] DOB: [birth year] Today's Date: 11/14/2014    History of Present Illness R THR - s/p L THR 5/16   Clinical Impression   Pt was admitted for the above surgery.  He was mod I prior to admission and now needs min guard for ambulation and up to max A for LB adls. Will follow in acute with supervision level goals    Follow Up Recommendations  Supervision/Assistance - 24 hour    Equipment Recommendations  None recommended by OT    Recommendations for Other Services       Precautions / Restrictions Precautions Precautions: Fall Restrictions Weight Bearing Restrictions: No      Mobility Bed Mobility         Supine to sit: Min assist     General bed mobility comments: assist for RLE  Transfers   Equipment used: Rolling walker (2 wheeled) Transfers: Sit to/from Stand Sit to Stand: Min assist         General transfer comment: cues for LE management and use of UEs to self assist    Balance                                            ADL Overall ADL's : Needs assistance/impaired     Grooming: Min guard;Standing       Lower Body Bathing: Moderate assistance;Sit to/from stand       Lower Body Dressing: Maximal assistance;Sit to/from stand   Toilet Transfer: Min guard;Ambulation;BSC             General ADL Comments: pt had a posterior LOB when standing at sink for teeth:  min A to recover  He does not have a reacher but is familiar with this. He would benefit from this for donning pants. He wants to be as independent as possible but will have someone stay at least the first night:  highly recommended this with LOB this am  Pt stated that he sponge bathed initially last time and plans to do that again.  He is not ready to step into tub     Vision     Perception     Praxis      Pertinent Vitals/Pain Pain Score: 3  Pain Location: R hip Pain  Descriptors / Indicators: Aching Pain Intervention(s): Limited activity within patient's tolerance;Monitored during session;Premedicated before session;Repositioned;Ice applied     Hand Dominance     Extremity/Trunk Assessment             Communication Communication Communication: No difficulties   Cognition Arousal/Alertness: Awake/alert Behavior During Therapy: WFL for tasks assessed/performed Overall Cognitive Status: Within Functional Limits for tasks assessed                     General Comments       Exercises       Shoulder Instructions      Home Living Family/patient expects to be discharged to:: Private residence Living Arrangements: Alone Available Help at Discharge: Friend(s);Available PRN/intermittently;Family               Bathroom Shower/Tub: Tub/shower unit;Curtain Shower/tub characteristics: Architectural technologist: Standard     Home Equipment: Bedside commode;Grab bars - toilet;Grab bars - tub/shower          Prior Functioning/Environment Level of Independence: Independent;Independent with assistive  device(s)             OT Diagnosis: Generalized weakness   OT Problem List: Decreased strength;Decreased activity tolerance;Pain;Decreased knowledge of use of DME or AE   OT Treatment/Interventions: Self-care/ADL training;DME and/or AE instruction;Patient/family education;Balance training    OT Goals(Current goals can be found in the care plan section) Acute Rehab OT Goals Patient Stated Goal: Do as well with this hip as the last one OT Goal Formulation: With patient Time For Goal Achievement: 11/21/14 Potential to Achieve Goals: Good ADL Goals Pt Will Perform Grooming: with supervision;standing Pt Will Perform Lower Body Bathing: with supervision;with adaptive equipment;sit to/from stand Pt Will Perform Lower Body Dressing: with supervision;with adaptive equipment;sit to/from stand Pt Will Transfer to Toilet: with  supervision;bedside commode;ambulating  OT Frequency: Min 2X/week   Barriers to D/C:            Co-evaluation              End of Session    Activity Tolerance: Patient tolerated treatment well Patient left: with call bell/phone within reach;in chair   Time: 2080-2233 OT Time Calculation (min): 26 min Charges:  OT General Charges $OT Visit: 1 Procedure OT Evaluation $Initial OT Evaluation Tier I: 1 Procedure OT Treatments $Self Care/Home Management : 8-22 mins G-Codes:    Gresia Isidoro 11-15-14, 9:14 AM  Lesle Chris, OTR/L [phone] 11/15/2014

## 2014-11-14 NOTE — Progress Notes (Signed)
Physical Therapy Treatment Patient Details Name: [name] [name] MRN: [id number] DOB: 08-03-1955 Today's Date: 11/14/2014    History of Present Illness R THR - s/p L THR 5/16    PT Comments    Pt progressing well with mobility and eager for dc home.  Reviewed stairs and car transfers.  Pt continues to express concern regarding leg length and is noticeably vaulting over while ambulating.  Follow Up Recommendations  Home health PT     Equipment Recommendations  None recommended by PT    Recommendations for Other Services OT consult     Precautions / Restrictions Precautions Precautions: Fall Restrictions Weight Bearing Restrictions: No    Mobility  Bed Mobility         Supine to sit: Min assist     General bed mobility comments: Pt OOB - declines back to bed`  Transfers Overall transfer level: Needs assistance Equipment used: Rolling walker (2 wheeled) Transfers: Sit to/from Stand Sit to Stand: Supervision         General transfer comment: cues for LE management and use of UEs to self assist  Ambulation/Gait Ambulation/Gait assistance: Min guard;Supervision Ambulation Distance (Feet): 120 Feet Assistive device: Rolling walker (2 wheeled) Gait Pattern/deviations: Step-to pattern;Step-through pattern;Decreased step length - right;Decreased step length - left;Shuffle;Trunk flexed Gait velocity: decr   General Gait Details: Pt vaulting noticeably over R LE and expressing concerns re leg length.  cues for sequence, posture and position from RW   Stairs Stairs: Yes Stairs assistance: Min assist Stair Management: One rail Right;Step to pattern;Forwards;With crutches Number of Stairs: 3 General stair comments: cues for sequence and foot/crutch placement  Wheelchair Mobility    Modified Rankin (Stroke Patients Only)       Balance                                    Cognition Arousal/Alertness: Awake/alert Behavior During Therapy: WFL  for tasks assessed/performed Overall Cognitive Status: Within Functional Limits for tasks assessed                      Exercises      General Comments        Pertinent Vitals/Pain Pain Assessment: 0-10 Pain Score: 3  Pain Location: R hip Pain Descriptors / Indicators: Aching;Sore Pain Intervention(s): Limited activity within patient's tolerance;Monitored during session;Premedicated before session;Ice applied    Home Living Family/patient expects to be discharged to:: Private residence Living Arrangements: Alone Available Help at Discharge: Friend(s);Available PRN/intermittently;Family         Home Equipment: Bedside commode;Grab bars - toilet;Grab bars - tub/shower      Prior Function Level of Independence: Independent;Independent with assistive device(s)          PT Goals (current goals can now be found in the care plan section) Acute Rehab PT Goals Patient Stated Goal: Do as well with this hip as the last one PT Goal Formulation: With patient Time For Goal Achievement: 11/23/14 Potential to Achieve Goals: Good Progress towards PT goals: Progressing toward goals    Frequency  7X/week    PT Plan Current plan remains appropriate    Co-evaluation             End of Session Equipment Utilized During Treatment: Gait belt Activity Tolerance: Patient tolerated treatment well Patient left: in chair;with call bell/phone within reach     Time: 8889-1694 PT Time Calculation (min) (ACUTE  ONLY): 30 min  Charges:  $Gait Training: 8-22 mins $Therapeutic Activity: 8-22 mins                    G Codes:      Cid [employer] 12/06/14, 11:33 AM

## 2014-11-14 NOTE — Progress Notes (Signed)
   11/14/14 1200  OT Visit Information  Last OT Received On 11/14/14  Assistance Needed +1  History of Present Illness R THR - s/p L THR 5/16  OT Time Calculation  OT Start Time (ACUTE ONLY) 1112  OT Stop Time (ACUTE ONLY) 1132  OT Time Calculation (min) 20 min  Precautions  Precautions Fall  Pain Assessment  Pain Score 3  Pain Location R hip  Pain Descriptors / Indicators Aching  Pain Intervention(s) Limited activity within patient's tolerance;Monitored during session;Premedicated before session;Repositioned;Ice applied  Cognition  Arousal/Alertness Awake/alert  Behavior During Therapy WFL for tasks assessed/performed  Overall Cognitive Status Within Functional Limits for tasks assessed  ADL  Lower Body Dressing Supervision/safety;With adaptive equipment;Sit to/from stand  General ADL Comments educated pt on reacher, and sock aide for dressing, which he used with min cues.  He was able to don underwear, pants, socks and shoes.  Pt did not want to use long shoehorn for shoe--wiggled foot in.  He keeps laces untied and threads them through crisscrossed laces for security.  No LOB this session  Bed Mobility  General bed mobility comments oob  Restrictions  Weight Bearing Restrictions No  Transfers  Equipment used Rolling walker (2 wheeled)  Transfers Sit to/from Stand  Sit to Stand Supervision  General transfer comment cues for UE Placement  OT - End of Session  Activity Tolerance Patient tolerated treatment well  Patient left with call bell/phone within reach;in chair  OT Assessment/Plan  Follow Up Recommendations Supervision/Assistance - 24 hour  OT Equipment None recommended by OT  OT Goal Progression  Progress towards OT goals Progressing toward goals  OT General Charges  $OT Visit 1 Procedure  OT Treatments  $Self Care/Home Management  8-22 mins  Lesle Chris, OTR/L [phone] 11/14/2014

## 2014-11-14 NOTE — Plan of Care (Signed)
Problem: Discharge Progression Outcomes Goal: Anticoagulant follow-up in place Outcome: Not Applicable Date Met:  29/02/11 asa

## 2014-11-18 NOTE — Discharge Summary (Signed)
Patient ID: [name] MRN: [id number] DOB/AGE: 1955/04/10 59 y.o.  Admit date: 11/12/2014 Discharge date: 11/18/2014  Admission Diagnoses:  Principal Problem:   Osteoarthritis of right hip Active Problems:   Status post total replacement of right hip   Discharge Diagnoses:  Same  Past Medical History  Diagnosis Date  . Morbid obesity   . HTN (hypertension)   . Chronic systolic heart failure   . Cardiomyopathy     secondary   . Coronary artery disease   . CHF (congestive heart failure)   . Arthritis   . Cancer 2007    colon cancer  . OSA (obstructive sleep apnea)     does not use CPAP    Surgeries: Procedure(s): RIGHT TOTAL HIP ARTHROPLASTY ANTERIOR APPROACH on 11/12/2014   Consultants:    Discharged Condition: Improved  Hospital Course: [name] is an 59 y.o. male who was admitted 11/12/2014 for operative treatment ofOsteoarthritis of right hip. Patient has severe unremitting pain that affects sleep, daily activities, and work/hobbies. After pre-op clearance the patient was taken to the operating room on 11/12/2014 and underwent  Procedure(s): RIGHT TOTAL HIP ARTHROPLASTY ANTERIOR APPROACH.    Patient was given perioperative antibiotics:  Anti-infectives    Start     Dose/Rate Route Frequency Ordered Stop   11/12/14 1600  clindamycin (CLEOCIN) IVPB 600 mg     600 mg 100 mL/hr over 30 Minutes Intravenous Every 6 hours 11/12/14 1338 11/12/14 2201   11/12/14 0759  clindamycin (CLEOCIN) IVPB 900 mg     900 mg 100 mL/hr over 30 Minutes Intravenous On call to O.R. 11/12/14 0759 11/12/14 1038       Patient was given sequential compression devices, early ambulation, and chemoprophylaxis to prevent DVT.  Patient benefited maximally from hospital stay and there were no complications.    Recent vital signs: No data found.    Recent laboratory studies: No results for input(s): WBC, HGB, HCT, PLT, NA, K, CL, CO2, BUN, CREATININE, GLUCOSE, INR, CALCIUM in  the last 72 hours.  Invalid input(s): PT, 2   Discharge Medications:     Medication List    STOP taking these medications        diclofenac 75 MG EC tablet  Commonly known as:  VOLTAREN     methocarbamol 500 MG tablet  Commonly known as:  ROBAXIN      TAKE these medications        amLODipine 10 MG tablet  Commonly known as:  NORVASC  TAKE 1 TABLET BY MOUTH EVERY DAY     aspirin 325 MG EC tablet  Take 1 tablet (325 mg total) by mouth 2 (two) times daily after a meal.     carvedilol 6.25 MG tablet  Commonly known as:  COREG  Take 6.25 mg by mouth 2 (two) times daily.     chlorthalidone 25 MG tablet  Commonly known as:  HYGROTON  Take 25 mg by mouth every morning.     lisinopril 40 MG tablet  Commonly known as:  PRINIVIL,ZESTRIL  Take 40 mg by mouth every morning.     oxyCODONE 5 MG immediate release tablet  Commonly known as:  Oxy IR/ROXICODONE  Take 1-3 tablets (5-15 mg total) by mouth every 3 (three) hours as needed for breakthrough pain.     tiZANidine 4 MG tablet  Commonly known as:  ZANAFLEX  Take 1 tablet (4 mg total) by mouth every 6 (six) hours as needed for muscle spasms.  Diagnostic Studies: Dg C-arm 1-60 Min-no Report  11/12/2014   CLINICAL DATA: hip   C-ARM 1-60 MINUTES  Fluoroscopy was utilized by the requesting physician.  No radiographic  interpretation.    Dg Hip Port Unilat With Pelvis 1v Right  11/12/2014   CLINICAL DATA:  Right hip replacement.  EXAM: DG HIP (WITH OR WITHOUT PELVIS) 1V PORT RIGHT  COMPARISON:  Intraoperative radiograph from the same day.  FINDINGS: The right total hip arthroplasty is now present. The hip is located. The components are well seated. Gas is present in the joint space, as expected. The a left hip prosthesis is also noted.  IMPRESSION: Right total hip arthroplasty without radiographic evidence for complication.   Electronically Signed   By: San Morelle M.D.   On: 11/12/2014 12:56   Dg Hip Unilat With  Pelvis 2-3 Views Right  11/12/2014   CLINICAL DATA:  Right total hip replacement.  EXAM: DG HIP (WITH OR WITHOUT PELVIS) 2-3V RIGHT  COMPARISON:  None.  FINDINGS: Changes of right hip replacement. Normal AP alignment. No hardware or bony complicating feature. Remote changes of left hip replacement.  IMPRESSION: Interval right hip replacement.  No complicating feature.   Electronically Signed   By: Rolm Baptise M.D.   On: 11/12/2014 12:11    Disposition: 01-Home or Self Care        Follow-up Information    Follow up with [name], MD In 2 weeks.   Specialty:  Orthopedic Surgery   Contact information:   [name] [id number] [phone]       Follow up with [facility].   Why:  Home Health Physical Therapy   Contact information:   [address] [name] [address] [phone]        Signed: [name] 11/18/2014, 3:52 PM

## 2015-02-14 ENCOUNTER — Ambulatory Visit (INDEPENDENT_AMBULATORY_CARE_PROVIDER_SITE_OTHER): Payer: Medicare Other | Admitting: Cardiovascular Disease

## 2015-02-14 ENCOUNTER — Encounter: Payer: Self-pay | Admitting: Cardiovascular Disease

## 2015-02-14 VITALS — BP 110/72 | HR 76 | Ht 65.0 in | Wt 233.0 lb

## 2015-02-14 DIAGNOSIS — I428 Other cardiomyopathies: Secondary | ICD-10-CM

## 2015-02-14 DIAGNOSIS — I429 Cardiomyopathy, unspecified: Secondary | ICD-10-CM

## 2015-02-14 DIAGNOSIS — I10 Essential (primary) hypertension: Secondary | ICD-10-CM

## 2015-02-14 NOTE — Progress Notes (Signed)
Patient ID: [name] [name], male   DOB: Aug 06, 1955, 59 y.o.   MRN: [id number]      SUBJECTIVE: The patient is here to followup for a nonischemic cardiomyopathy and essential hypertension.  Echocardiogram on 02/10/14 demonstrated normalization of left ventricular systolic function, EF 74%, basal inferolateral hypokinesis, grade 1 diastolic dysfunction, and mild tricuspid regurgitation.  He successfully underwent right hip replacement in August 2016.  The patient denies any symptoms of chest pain, palpitations, shortness of breath, lightheadedness, dizziness, leg swelling, orthopnea, PND, and syncope.   Review of Systems: As per "subjective", otherwise negative.  Allergies  Allergen Reactions  . Penicillins     REACTION: rash    Current Outpatient Prescriptions  Medication Sig Dispense Refill  . amLODipine (NORVASC) 10 MG tablet TAKE 1 TABLET BY MOUTH EVERY DAY 30 tablet 11  . aspirin 325 MG EC tablet Take 1 tablet (325 mg total) by mouth 2 (two) times daily after a meal. 30 tablet 0  . carvedilol (COREG) 6.25 MG tablet Take 6.25 mg by mouth 2 (two) times daily.    . chlorthalidone (HYGROTON) 25 MG tablet Take 25 mg by mouth every morning.     Marland Kitchen lisinopril (PRINIVIL,ZESTRIL) 40 MG tablet Take 40 mg by mouth every morning.     Marland Kitchen oxyCODONE (OXY IR/ROXICODONE) 5 MG immediate release tablet Take 1-3 tablets (5-15 mg total) by mouth every 3 (three) hours as needed for breakthrough pain. 60 tablet 0  . tiZANidine (ZANAFLEX) 4 MG tablet Take 1 tablet (4 mg total) by mouth every 6 (six) hours as needed for muscle spasms. 30 tablet 0   No current facility-administered medications for this visit.    Past Medical History  Diagnosis Date  . Morbid obesity ([place])   . HTN (hypertension)   . Chronic systolic heart failure ([place])   . Cardiomyopathy     secondary   . Coronary artery disease   . CHF (congestive heart failure) ([place])   . Arthritis   . Cancer [facility]) 2007    colon cancer  . OSA  (obstructive sleep apnea)     does not use CPAP    Past Surgical History  Procedure Laterality Date  . Colon surgery  2007    surgery for colon cancer  . Total hip arthroplasty Left 08/27/2014    Procedure: LEFT TOTAL HIP ARTHROPLASTY ANTERIOR APPROACH;  Surgeon: [name], MD;  Location: [facility];  Service: Orthopedics;  Laterality: Left;  . Total hip arthroplasty Right 11/12/2014    Procedure: RIGHT TOTAL HIP ARTHROPLASTY ANTERIOR APPROACH;  Surgeon: [name], MD;  Location: [facility];  Service: Orthopedics;  Laterality: Right;    Social History   Social History  . Marital Status: Single    Spouse Name: N/A  . Number of Children: N/A  . Years of Education: N/A   Occupational History  . Not on file.   Social History Main Topics  . Smoking status: Current Every Day Smoker -- 0.50 packs/day for 40 years    Types: Cigarettes    Start date: 08/01/1974  . Smokeless tobacco: Never Used  . Alcohol Use: No  . Drug Use: No  . Sexual Activity: Not on file   Other Topics Concern  . Not on file   Social History Narrative   Disabled.      Filed Vitals:   02/14/15 1139  BP: 110/72  Pulse: 76  Height: 5\' 5"  (1.651 m)  Weight: 233 lb (105.688 kg)  SpO2: 96%  PHYSICAL EXAM General: NAD HEENT: Normal. Neck: No JVD, no thyromegaly. Lungs: Clear to auscultation bilaterally with normal respiratory effort. CV: Nondisplaced PMI.  Regular rate and rhythm, normal S1/S2, no S3/S4, no murmur. No pretibial or periankle edema.  No carotid bruit.  Normal pedal pulses.  Abdomen: Soft, nontender, no hepatosplenomegaly, no distention.  Neurologic: Alert and oriented x 3.  Psych: Normal affect. Skin: Normal. Musculoskeletal: Normal range of motion, no gross deformities. Extremities: No clubbing or cyanosis.   ECG: Most recent ECG reviewed.      ASSESSMENT AND PLAN: 1. Nonischemic cardiomyopathy/chronic systolic heart failure: Symptomatically stable with no  signs of heart failure. LVEF has since normalized as noted above. Continue carvedilol 6.25 mg twice daily along with lisinopril 40 mg daily.   2. Essential HTN: Well controlled on current therapy which includes carvedilol, chlorthalidone, and lisinopril. However, due to occasional episodic dizziness since LV normalization, will reduce chlorthalidone to 12.5 mg.  Dispo: f/u 1 year.   Kate Sable, M.D., F.A.C.C.

## 2015-05-27 DIAGNOSIS — I10 Essential (primary) hypertension: Secondary | ICD-10-CM | POA: Diagnosis not present

## 2015-05-27 DIAGNOSIS — Z720 Tobacco use: Secondary | ICD-10-CM | POA: Diagnosis not present

## 2015-05-27 DIAGNOSIS — I509 Heart failure, unspecified: Secondary | ICD-10-CM | POA: Diagnosis not present

## 2015-08-10 DIAGNOSIS — I509 Heart failure, unspecified: Secondary | ICD-10-CM | POA: Diagnosis not present

## 2015-08-10 DIAGNOSIS — Z202 Contact with and (suspected) exposure to infections with a predominantly sexual mode of transmission: Secondary | ICD-10-CM | POA: Diagnosis not present

## 2015-08-10 DIAGNOSIS — Z299 Encounter for prophylactic measures, unspecified: Secondary | ICD-10-CM | POA: Diagnosis not present

## 2015-08-10 DIAGNOSIS — Z720 Tobacco use: Secondary | ICD-10-CM | POA: Diagnosis not present

## 2015-08-10 DIAGNOSIS — Z6841 Body Mass Index (BMI) 40.0 and over, adult: Secondary | ICD-10-CM | POA: Diagnosis not present

## 2015-08-30 DIAGNOSIS — Z1389 Encounter for screening for other disorder: Secondary | ICD-10-CM | POA: Diagnosis not present

## 2015-08-30 DIAGNOSIS — E7800 Pure hypercholesterolemia, unspecified: Secondary | ICD-10-CM | POA: Diagnosis not present

## 2015-08-30 DIAGNOSIS — I509 Heart failure, unspecified: Secondary | ICD-10-CM | POA: Diagnosis not present

## 2015-08-30 DIAGNOSIS — Z299 Encounter for prophylactic measures, unspecified: Secondary | ICD-10-CM | POA: Diagnosis not present

## 2015-08-30 DIAGNOSIS — Z6841 Body Mass Index (BMI) 40.0 and over, adult: Secondary | ICD-10-CM | POA: Diagnosis not present

## 2015-08-30 DIAGNOSIS — Z7189 Other specified counseling: Secondary | ICD-10-CM | POA: Diagnosis not present

## 2015-08-30 DIAGNOSIS — Z79899 Other long term (current) drug therapy: Secondary | ICD-10-CM | POA: Diagnosis not present

## 2015-08-30 DIAGNOSIS — Z0000 Encounter for general adult medical examination without abnormal findings: Secondary | ICD-10-CM | POA: Diagnosis not present

## 2015-09-12 DIAGNOSIS — E119 Type 2 diabetes mellitus without complications: Secondary | ICD-10-CM | POA: Diagnosis not present

## 2015-09-12 DIAGNOSIS — R7301 Impaired fasting glucose: Secondary | ICD-10-CM | POA: Diagnosis not present

## 2015-09-12 DIAGNOSIS — Z299 Encounter for prophylactic measures, unspecified: Secondary | ICD-10-CM | POA: Diagnosis not present

## 2015-09-12 DIAGNOSIS — I509 Heart failure, unspecified: Secondary | ICD-10-CM | POA: Diagnosis not present

## 2015-11-28 DIAGNOSIS — I428 Other cardiomyopathies: Secondary | ICD-10-CM | POA: Diagnosis not present

## 2015-11-28 DIAGNOSIS — I10 Essential (primary) hypertension: Secondary | ICD-10-CM | POA: Diagnosis not present

## 2015-11-28 DIAGNOSIS — I509 Heart failure, unspecified: Secondary | ICD-10-CM | POA: Diagnosis not present

## 2015-11-28 DIAGNOSIS — Z299 Encounter for prophylactic measures, unspecified: Secondary | ICD-10-CM | POA: Diagnosis not present

## 2016-02-29 ENCOUNTER — Encounter: Payer: Self-pay | Admitting: *Deleted

## 2016-03-05 ENCOUNTER — Ambulatory Visit (INDEPENDENT_AMBULATORY_CARE_PROVIDER_SITE_OTHER): Payer: Medicare Other | Admitting: Cardiovascular Disease

## 2016-03-05 ENCOUNTER — Encounter: Payer: Self-pay | Admitting: Cardiovascular Disease

## 2016-03-05 VITALS — BP 114/69 | HR 70 | Ht 66.0 in | Wt 260.0 lb

## 2016-03-05 DIAGNOSIS — I10 Essential (primary) hypertension: Secondary | ICD-10-CM | POA: Diagnosis not present

## 2016-03-05 DIAGNOSIS — I428 Other cardiomyopathies: Secondary | ICD-10-CM

## 2016-03-05 NOTE — Progress Notes (Signed)
SUBJECTIVE: The patient is here to followup for a nonischemic cardiomyopathy and essential hypertension.  Echocardiogram on 02/10/14 demonstrated normalization of left ventricular systolic function, EF [id number], basal inferolateral hypokinesis, grade 1 diastolic dysfunction, and mild tricuspid regurgitation.  The patient denies any symptoms of chest pain, palpitations, shortness of breath, lightheadedness, dizziness, leg swelling, orthopnea, PND, and syncope.  He continues to fish and do the activities he likes doing without any limitations.  ECG performed in the office today which I personally interpreted demonstrated sinus rhythm with first-degree AV block, PR 230 ms, and a diffuse nonspecific T wave abnormality.   Review of Systems: As per "subjective", otherwise negative.  Allergies  Allergen Reactions  . Penicillins     REACTION: rash    Current Outpatient Prescriptions  Medication Sig Dispense Refill  . amLODipine (NORVASC) 10 MG tablet TAKE 1 TABLET BY MOUTH EVERY DAY 30 tablet 11  . aspirin 325 MG EC tablet Take 1 tablet (325 mg total) by mouth 2 (two) times daily after a meal. 30 tablet 0  . carvedilol (COREG) 6.25 MG tablet Take 6.25 mg by mouth 2 (two) times daily.    . chlorthalidone (HYGROTON) 25 MG tablet Take 25 mg by mouth every morning.     Marland Kitchen lisinopril (PRINIVIL,ZESTRIL) 40 MG tablet Take 40 mg by mouth every morning.     Marland Kitchen oxyCODONE (OXY IR/ROXICODONE) 5 MG immediate release tablet Take 1-3 tablets (5-15 mg total) by mouth every 3 (three) hours as needed for breakthrough pain. 60 tablet 0  . tiZANidine (ZANAFLEX) 4 MG tablet Take 1 tablet (4 mg total) by mouth every 6 (six) hours as needed for muscle spasms. 30 tablet 0   No current facility-administered medications for this visit.     Past Medical History:  Diagnosis Date  . Arthritis   . Cancer [facility]) 2007   colon cancer  . Cardiomyopathy    secondary   . CHF (congestive heart failure) ([place])   .  Chronic systolic heart failure ([place])   . Coronary artery disease   . HTN (hypertension)   . Morbid obesity ([place])   . OSA (obstructive sleep apnea)    does not use CPAP    Past Surgical History:  Procedure Laterality Date  . COLON SURGERY  2007   surgery for colon cancer  . TOTAL HIP ARTHROPLASTY Left 08/27/2014   Procedure: LEFT TOTAL HIP ARTHROPLASTY ANTERIOR APPROACH;  Surgeon: [name], MD;  Location: [facility];  Service: Orthopedics;  Laterality: Left;  . TOTAL HIP ARTHROPLASTY Right 11/12/2014   Procedure: RIGHT TOTAL HIP ARTHROPLASTY ANTERIOR APPROACH;  Surgeon: [name], MD;  Location[facility];  Service: Orthopedics;  Laterality: Right;    Social History   Social History  . Marital status: Single    Spouse name: N/A  . Number of children: N/A  . Years of education: N/A   Occupational History  . Not on file.   Social History Main Topics  . Smoking status: Current Every Day Smoker    Packs/day: 0.50    Years: 40.00    Types: Cigarettes    Start date: 08/01/1974  . Smokeless tobacco: Never Used  . Alcohol use No  . Drug use: No  . Sexual activity: Not on file   Other Topics Concern  . Not on file   Social History Narrative   Disabled.      Vitals:   03/05/16 1327  BP: 114/69  Pulse: 70  Weight:  260 lb (117.9 kg)  Height: 5\' 6"  (1.676 m)    PHYSICAL EXAM General: NAD HEENT: Normal. Neck: No JVD, no thyromegaly. Lungs: Clear to auscultation bilaterally with normal respiratory effort. CV: Nondisplaced PMI.  Regular rate and rhythm, normal S1/S2, no S3/S4, no murmur. No pretibial or periankle edema.  No carotid bruit.   Abdomen: Obese.  Neurologic: Alert and oriented.  Psych: Normal affect. Skin: Normal. Musculoskeletal: No gross deformities.    ECG: Most recent ECG reviewed.      ASSESSMENT AND PLAN: 1. Nonischemic cardiomyopathy/chronic systolic heart failure: Symptomatically stable with no signs of heart failure. LVEF  has since normalized as noted above. Continue carvedilol 6.25 mg twice daily along with lisinopril 40 mg daily.  Exercise encouraged.  2. Essential HTN: Well controlled on current therapy which includes carvedilol, chlorthalidone, amlodipine, and lisinopril. No changes.  Dispo: f/u 1 year.   Kate Sable, M.D., F.A.C.C.

## 2016-03-05 NOTE — Patient Instructions (Signed)

## 2016-12-13 DIAGNOSIS — Z7984 Long term (current) use of oral hypoglycemic drugs: Secondary | ICD-10-CM | POA: Diagnosis not present

## 2016-12-13 DIAGNOSIS — R61 Generalized hyperhidrosis: Secondary | ICD-10-CM | POA: Diagnosis not present

## 2016-12-13 DIAGNOSIS — Z299 Encounter for prophylactic measures, unspecified: Secondary | ICD-10-CM | POA: Diagnosis not present

## 2016-12-13 DIAGNOSIS — I428 Other cardiomyopathies: Secondary | ICD-10-CM | POA: Diagnosis not present

## 2016-12-13 DIAGNOSIS — E1122 Type 2 diabetes mellitus with diabetic chronic kidney disease: Secondary | ICD-10-CM | POA: Diagnosis not present

## 2016-12-13 DIAGNOSIS — R079 Chest pain, unspecified: Secondary | ICD-10-CM | POA: Diagnosis not present

## 2016-12-13 DIAGNOSIS — F17210 Nicotine dependence, cigarettes, uncomplicated: Secondary | ICD-10-CM | POA: Diagnosis not present

## 2016-12-13 DIAGNOSIS — E1165 Type 2 diabetes mellitus with hyperglycemia: Secondary | ICD-10-CM | POA: Diagnosis not present

## 2016-12-13 DIAGNOSIS — Z6841 Body Mass Index (BMI) 40.0 and over, adult: Secondary | ICD-10-CM | POA: Diagnosis not present

## 2016-12-13 DIAGNOSIS — Z7982 Long term (current) use of aspirin: Secondary | ICD-10-CM | POA: Diagnosis not present

## 2016-12-13 DIAGNOSIS — N189 Chronic kidney disease, unspecified: Secondary | ICD-10-CM | POA: Diagnosis not present

## 2016-12-13 DIAGNOSIS — I429 Cardiomyopathy, unspecified: Secondary | ICD-10-CM | POA: Diagnosis not present

## 2016-12-13 DIAGNOSIS — I509 Heart failure, unspecified: Secondary | ICD-10-CM | POA: Diagnosis not present

## 2016-12-13 DIAGNOSIS — Z79899 Other long term (current) drug therapy: Secondary | ICD-10-CM | POA: Diagnosis not present

## 2016-12-13 DIAGNOSIS — I130 Hypertensive heart and chronic kidney disease with heart failure and stage 1 through stage 4 chronic kidney disease, or unspecified chronic kidney disease: Secondary | ICD-10-CM | POA: Diagnosis not present

## 2016-12-14 DIAGNOSIS — E1122 Type 2 diabetes mellitus with diabetic chronic kidney disease: Secondary | ICD-10-CM | POA: Diagnosis not present

## 2016-12-14 DIAGNOSIS — R079 Chest pain, unspecified: Secondary | ICD-10-CM | POA: Diagnosis not present

## 2016-12-14 DIAGNOSIS — I130 Hypertensive heart and chronic kidney disease with heart failure and stage 1 through stage 4 chronic kidney disease, or unspecified chronic kidney disease: Secondary | ICD-10-CM | POA: Diagnosis not present

## 2016-12-14 DIAGNOSIS — N189 Chronic kidney disease, unspecified: Secondary | ICD-10-CM | POA: Diagnosis not present

## 2016-12-14 DIAGNOSIS — E1165 Type 2 diabetes mellitus with hyperglycemia: Secondary | ICD-10-CM | POA: Diagnosis not present

## 2016-12-14 DIAGNOSIS — I509 Heart failure, unspecified: Secondary | ICD-10-CM | POA: Diagnosis not present

## 2016-12-19 IMAGING — DX DG HIP (WITH OR WITHOUT PELVIS) 1V PORT*L*
2 series · 2 of 2 positions shown · non-contrast
Comparison: Film from earlier in the same day

CLINICAL DATA: Left hip replacement

EXAM:
LEFT HIP (WITH PELVIS) 1 VIEW PORTABLE

[pelvis ap]
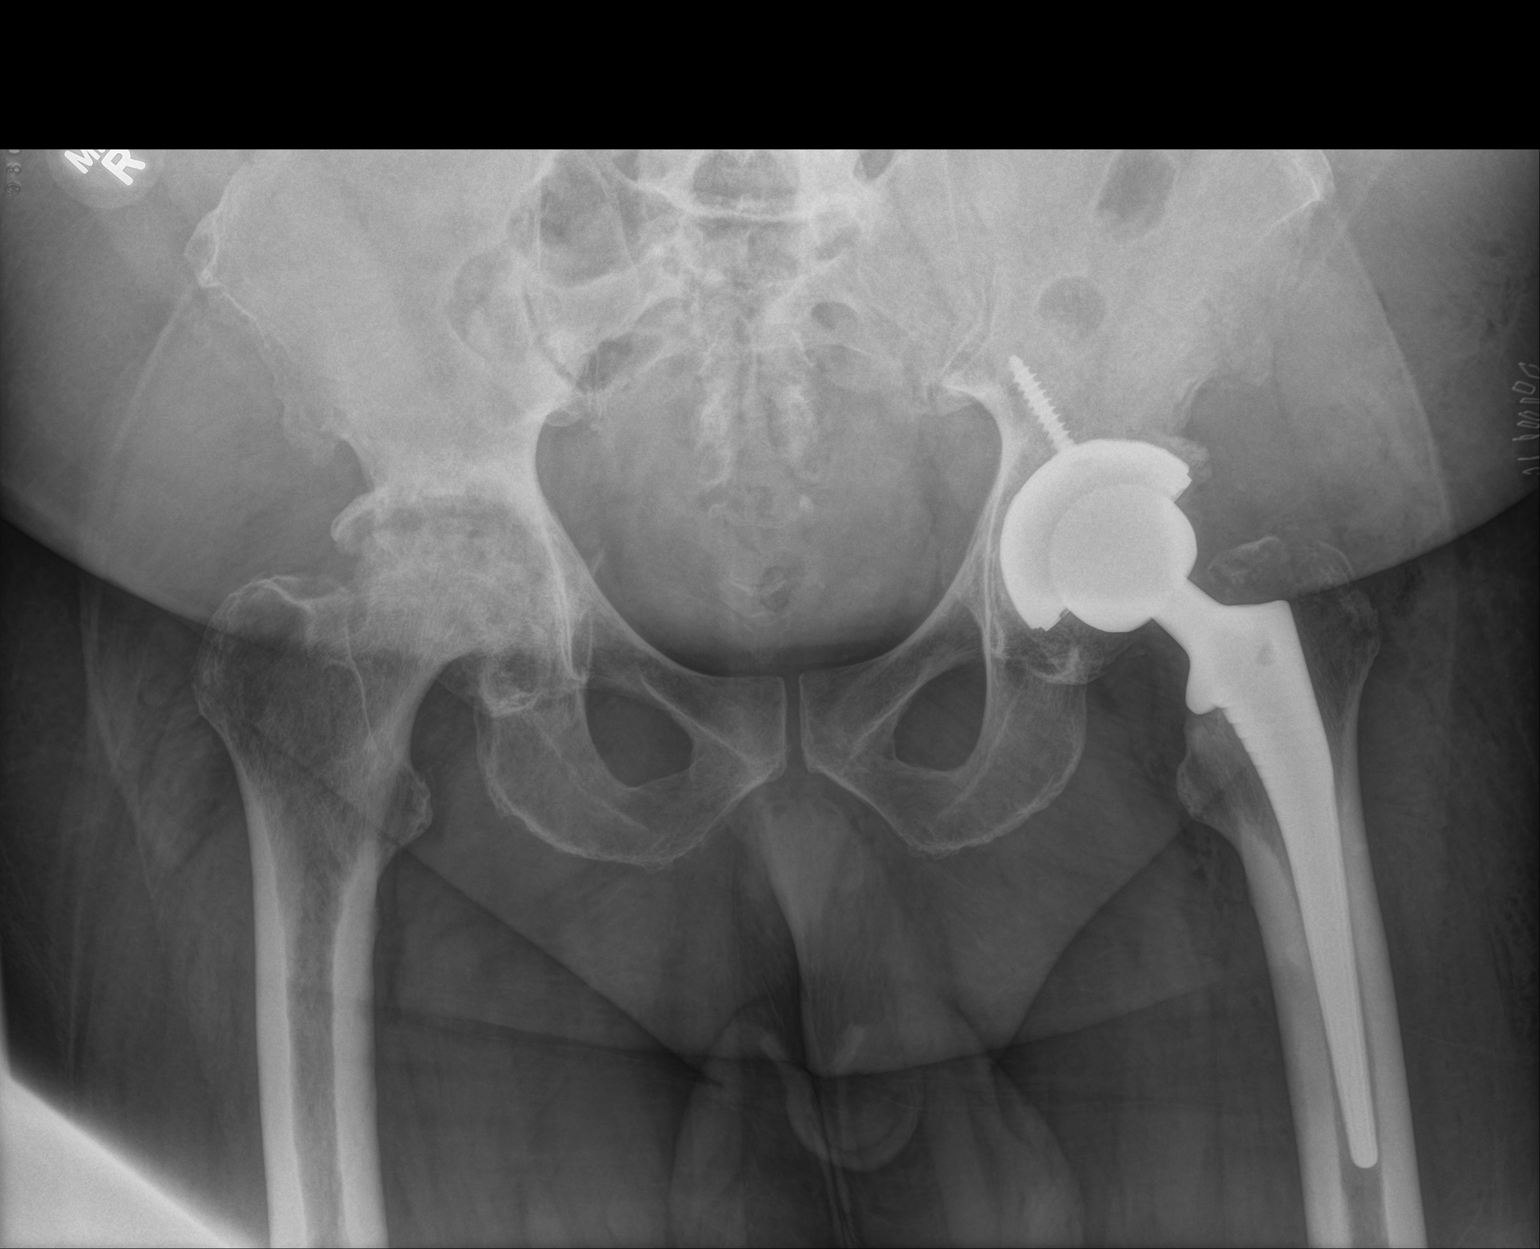

[hip x-table]
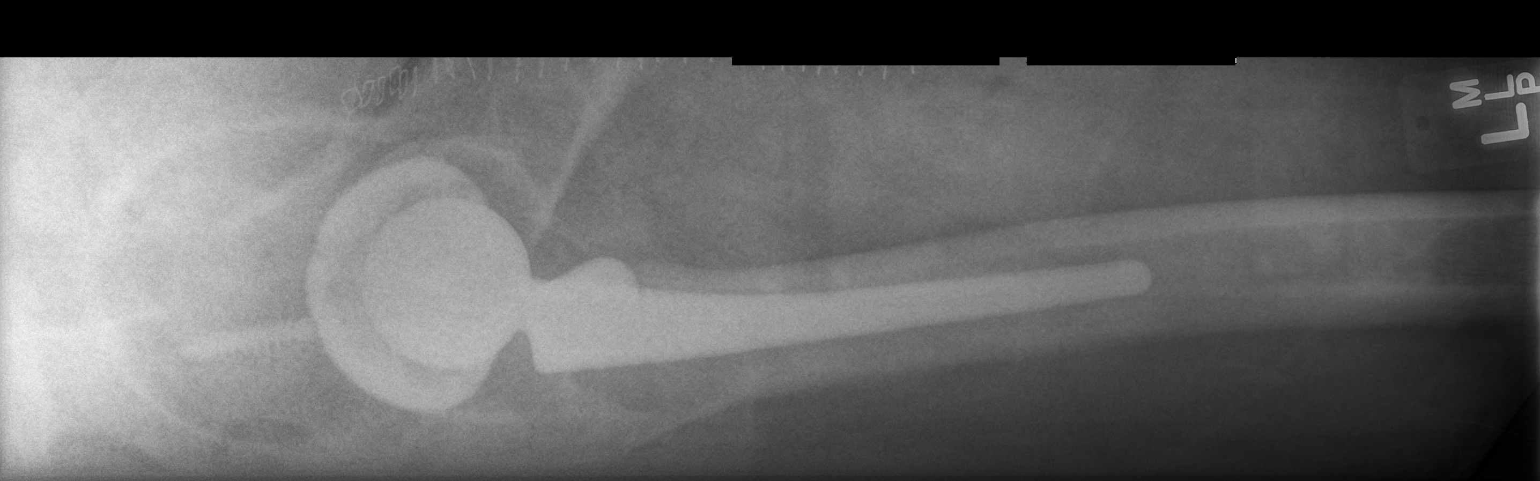

[2 of 2 positions shown; findings below may reference images not displayed]

FINDINGS: Left hip replacement is noted similar to that seen on earlier film.
No acute bony abnormality is seen. Severe degenerative changes of
the right proximal femur and hip joint are noted. The pelvic ring is
intact.
IMPRESSION: Status post left hip replacement.

## 2016-12-19 IMAGING — RF DG HIP (WITH OR WITHOUT PELVIS) 1V PORT*L*
1 series · 2 of 2 positions shown · non-contrast
Comparison: None.

CLINICAL DATA: Left total hip replacement.

EXAM:
LEFT HIP (WITH PELVIS) 1 VIEW PORTABLE; DG C-ARM 61-120 MIN - NRPT
[facility]

[Series 1: run · 2 of 2 slices shown]
[im 1/2]
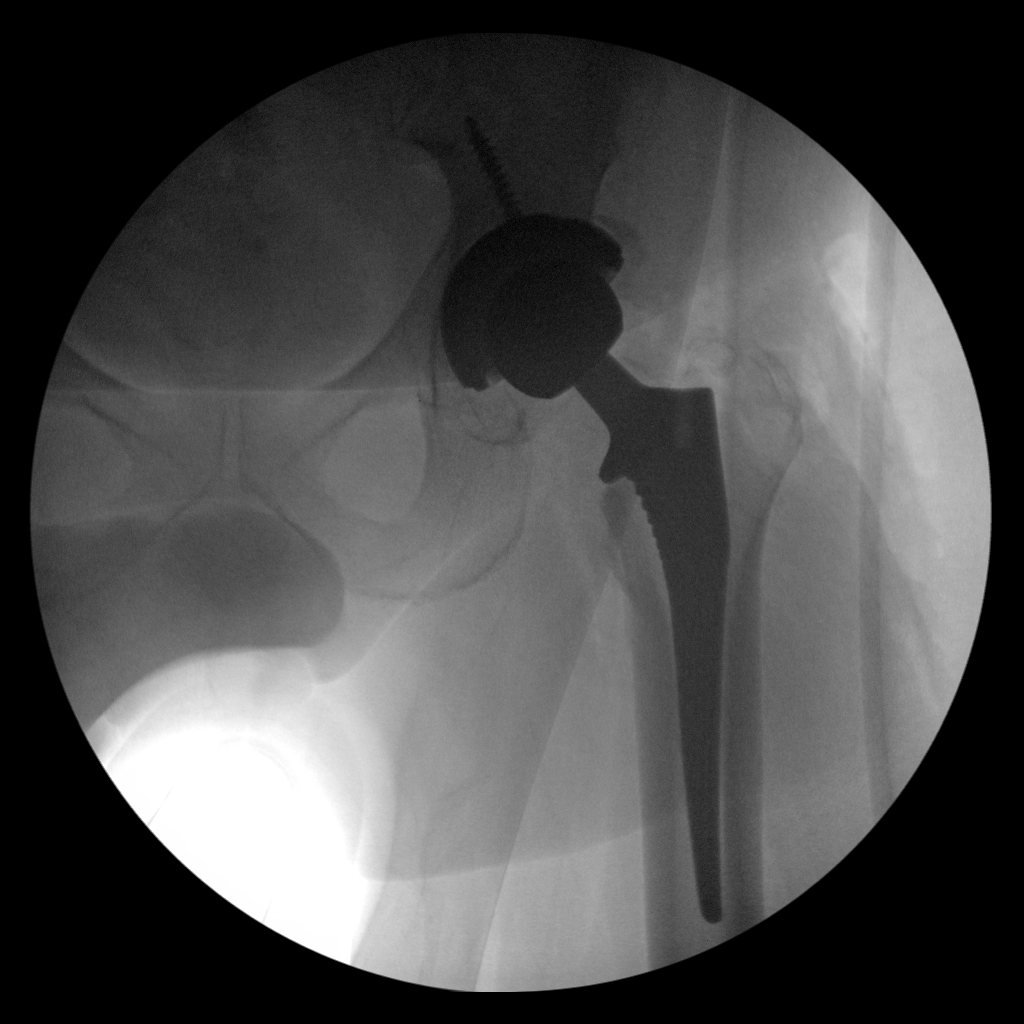
[im 2/2]
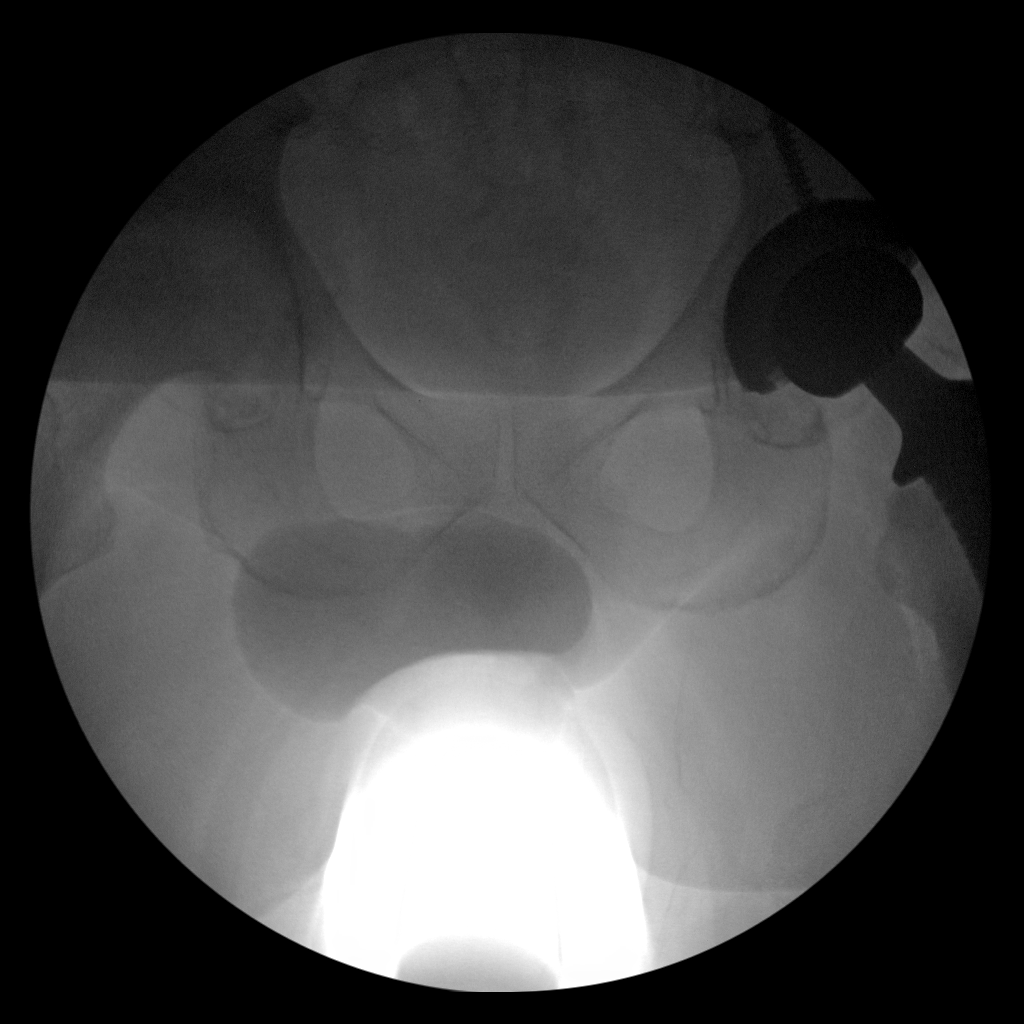

[2 of 2 positions shown; findings below may reference images not displayed]

FINDINGS: Two intraoperative fluoroscopic images of the pelvis demonstrate the
patient be status post left total hip arthroplasty. The femoral and
acetabular components appear to be well situated. No fracture or
dislocation is noted.
IMPRESSION: Status post left total hip arthroplasty.

## 2016-12-21 DIAGNOSIS — I509 Heart failure, unspecified: Secondary | ICD-10-CM | POA: Diagnosis not present

## 2016-12-21 DIAGNOSIS — E7800 Pure hypercholesterolemia, unspecified: Secondary | ICD-10-CM | POA: Diagnosis not present

## 2016-12-21 DIAGNOSIS — G809 Cerebral palsy, unspecified: Secondary | ICD-10-CM | POA: Diagnosis not present

## 2016-12-21 DIAGNOSIS — Z6841 Body Mass Index (BMI) 40.0 and over, adult: Secondary | ICD-10-CM | POA: Diagnosis not present

## 2016-12-21 DIAGNOSIS — Z299 Encounter for prophylactic measures, unspecified: Secondary | ICD-10-CM | POA: Diagnosis not present

## 2016-12-21 DIAGNOSIS — E1165 Type 2 diabetes mellitus with hyperglycemia: Secondary | ICD-10-CM | POA: Diagnosis not present

## 2016-12-21 DIAGNOSIS — F17210 Nicotine dependence, cigarettes, uncomplicated: Secondary | ICD-10-CM | POA: Diagnosis not present

## 2016-12-21 DIAGNOSIS — I428 Other cardiomyopathies: Secondary | ICD-10-CM | POA: Diagnosis not present

## 2016-12-21 DIAGNOSIS — I10 Essential (primary) hypertension: Secondary | ICD-10-CM | POA: Diagnosis not present

## 2017-01-04 DIAGNOSIS — I509 Heart failure, unspecified: Secondary | ICD-10-CM | POA: Diagnosis not present

## 2017-01-04 DIAGNOSIS — F17210 Nicotine dependence, cigarettes, uncomplicated: Secondary | ICD-10-CM | POA: Diagnosis not present

## 2017-01-04 DIAGNOSIS — Z299 Encounter for prophylactic measures, unspecified: Secondary | ICD-10-CM | POA: Diagnosis not present

## 2017-01-04 DIAGNOSIS — I10 Essential (primary) hypertension: Secondary | ICD-10-CM | POA: Diagnosis not present

## 2017-01-04 DIAGNOSIS — I428 Other cardiomyopathies: Secondary | ICD-10-CM | POA: Diagnosis not present

## 2017-01-04 DIAGNOSIS — Z6839 Body mass index (BMI) 39.0-39.9, adult: Secondary | ICD-10-CM | POA: Diagnosis not present

## 2017-01-04 DIAGNOSIS — E1165 Type 2 diabetes mellitus with hyperglycemia: Secondary | ICD-10-CM | POA: Diagnosis not present

## 2017-01-04 DIAGNOSIS — G809 Cerebral palsy, unspecified: Secondary | ICD-10-CM | POA: Diagnosis not present

## 2017-03-05 ENCOUNTER — Ambulatory Visit (INDEPENDENT_AMBULATORY_CARE_PROVIDER_SITE_OTHER): Payer: Medicare Other | Admitting: Cardiovascular Disease

## 2017-03-05 ENCOUNTER — Encounter: Payer: Self-pay | Admitting: Cardiovascular Disease

## 2017-03-05 ENCOUNTER — Other Ambulatory Visit: Payer: Self-pay

## 2017-03-05 VITALS — BP 120/78 | HR 71 | Ht 65.0 in | Wt 236.0 lb

## 2017-03-05 DIAGNOSIS — I428 Other cardiomyopathies: Secondary | ICD-10-CM

## 2017-03-05 DIAGNOSIS — E119 Type 2 diabetes mellitus without complications: Secondary | ICD-10-CM

## 2017-03-05 DIAGNOSIS — I10 Essential (primary) hypertension: Secondary | ICD-10-CM

## 2017-03-05 NOTE — Patient Instructions (Signed)

## 2017-03-05 NOTE — Progress Notes (Signed)
SUBJECTIVE: The patient is here to followup for a nonischemic cardiomyopathy and essential hypertension.  Echocardiogram on 02/10/14 demonstrated normalization of left ventricular systolic function, EF 14%, basal inferolateral hypokinesis, grade 1 diastolic dysfunction, and mild tricuspid regurgitation.  The patient denies any symptoms of chest pain, palpitations, shortness of breath, lightheadedness, dizziness, leg swelling, orthopnea, PND, and syncope.  He like staying active and does quite a bit of fishing.  He enjoyed Thanksgiving with his sister and 2 of his girlfriends.  He was diagnosed with type 2 diabetes mellitus this past September.  He is now on metformin and insulin.  He checks his blood sugars regularly and they range from 120-150.  He used to weigh as much as 300 pounds and now weighs 236 pounds.  He avoids eating carbohydrates such as rice.      Review of Systems: As per "subjective", otherwise negative.  Allergies  Allergen Reactions  . Penicillins     REACTION: rash    Current Outpatient Medications  Medication Sig Dispense Refill  . amLODipine (NORVASC) 10 MG tablet TAKE 1 TABLET BY MOUTH EVERY DAY 30 tablet 11  . ASPIRIN ADULT LOW STRENGTH 81 MG EC tablet Take 81 mg by mouth daily.  12  . carvedilol (COREG) 6.25 MG tablet Take 6.25 mg by mouth 2 (two) times daily.    . chlorthalidone (HYGROTON) 25 MG tablet Take 25 mg by mouth every morning.     Marland Kitchen lisinopril (PRINIVIL,ZESTRIL) 40 MG tablet Take 40 mg by mouth every morning.     . metFORMIN (GLUCOPHAGE) 500 MG tablet Take 500 mg by mouth 2 (two) times daily.  0  . TRESIBA FLEXTOUCH 100 UNIT/ML SOPN FlexTouch Pen INJECT 10 units DAILY  0   No current facility-administered medications for this visit.     Past Medical History:  Diagnosis Date  . Arthritis   . Cancer [facility]) 2007   colon cancer  . Cardiomyopathy    secondary   . CHF (congestive heart failure) ([place])   . Chronic systolic heart  failure ([place])   . Coronary artery disease   . HTN (hypertension)   . Morbid obesity ([place])   . OSA (obstructive sleep apnea)    does not use CPAP    Past Surgical History:  Procedure Laterality Date  . COLON SURGERY  2007   surgery for colon cancer  . TOTAL HIP ARTHROPLASTY Left 08/27/2014   Procedure: LEFT TOTAL HIP ARTHROPLASTY ANTERIOR APPROACH;  Surgeon: [name], MD;  [facility];  Service: Orthopedics;  Laterality: Left;  . TOTAL HIP ARTHROPLASTY Right 11/12/2014   Procedure: RIGHT TOTAL HIP ARTHROPLASTY ANTERIOR APPROACH;  Surgeon: [name], MD;  [facility];  Service: Orthopedics;  Laterality: Right;    Social History   Socioeconomic History  . Marital status: Single    Spouse name: Not on file  . Number of children: Not on file  . Years of education: Not on file  . Highest education level: Not on file  Social Needs  . Financial resource strain: Not on file  . Food insecurity - worry: Not on file  . Food insecurity - inability: Not on file  . Transportation needs - medical: Not on file  . Transportation needs - non-medical: Not on file  Occupational History  . Not on file  Tobacco Use  . Smoking status: Current Every Day Smoker    Packs/day: 0.25    Years: 40.00    Pack years:  10.00    Types: Cigarettes    Start date: 08/01/1974  . Smokeless tobacco: Never Used  Substance and Sexual Activity  . Alcohol use: No  . Drug use: No  . Sexual activity: Not on file  Other Topics Concern  . Not on file  Social History Narrative   Disabled.      Vitals:   03/05/17 0850  BP: 120/78  Pulse: 71  SpO2: 94%  Weight: 236 lb (107 kg)  Height: 5\' 5"  (1.651 m)    Wt Readings from Last 3 Encounters:  03/05/17 236 lb (107 kg)  03/05/16 260 lb (117.9 kg)  02/14/15 233 lb (105.7 kg)     PHYSICAL EXAM General: NAD HEENT: Normal. Neck: No JVD, no thyromegaly. Lungs: Clear to auscultation bilaterally with normal respiratory  effort. CV: Regular rate and rhythm, normal S1/S2, no S3/S4, no murmur. No pretibial or periankle edema.  No carotid bruit.   Abdomen: Soft, nontender, no distention.  Neurologic: Alert and oriented.  Psych: Normal affect. Skin: Normal. Musculoskeletal: No gross deformities.    ECG: Most recent ECG reviewed.   Labs: Lab Results  Component Value Date/Time   K 4.7 11/13/2014 04:46 AM   BUN 42 (H) 11/13/2014 04:46 AM   CREATININE 1.90 (H) 11/13/2014 04:46 AM   HGB 9.7 (L) 11/14/2014 05:03 AM     Lipids: No results found for: LDLCALC, LDLDIRECT, CHOL, TRIG, HDL     ASSESSMENT AND PLAN:  1. Nonischemic cardiomyopathy/chronic systolic heart failure: Symptomatically stable with no signs of heart failure. LVEF has since normalized as noted above. Continue carvedilol 6.25 mg twice daily along with lisinopril 40 mg daily.   2. Essential HTN: Well controlled on current therapy which includes carvedilol, chlorthalidone, amlodipine, and lisinopril. No changes.  3.  Type 2 diabetes mellitus: He was diagnosed with type 2 diabetes mellitus this past September.  He is now on metformin and insulin.  He checks his blood sugars regularly and they range from 120-150.   Disposition: Follow up 1 yr.   Kate Sable, M.D., F.A.C.C.

## 2017-10-17 DIAGNOSIS — I509 Heart failure, unspecified: Secondary | ICD-10-CM | POA: Diagnosis not present

## 2017-10-17 DIAGNOSIS — Z6841 Body Mass Index (BMI) 40.0 and over, adult: Secondary | ICD-10-CM | POA: Diagnosis not present

## 2017-10-17 DIAGNOSIS — E1165 Type 2 diabetes mellitus with hyperglycemia: Secondary | ICD-10-CM | POA: Diagnosis not present

## 2017-10-17 DIAGNOSIS — Z299 Encounter for prophylactic measures, unspecified: Secondary | ICD-10-CM | POA: Diagnosis not present

## 2017-10-17 DIAGNOSIS — M2550 Pain in unspecified joint: Secondary | ICD-10-CM | POA: Diagnosis not present

## 2017-10-17 DIAGNOSIS — I10 Essential (primary) hypertension: Secondary | ICD-10-CM | POA: Diagnosis not present

## 2017-10-17 DIAGNOSIS — F17210 Nicotine dependence, cigarettes, uncomplicated: Secondary | ICD-10-CM | POA: Diagnosis not present

## 2017-10-31 DIAGNOSIS — E1165 Type 2 diabetes mellitus with hyperglycemia: Secondary | ICD-10-CM | POA: Diagnosis not present

## 2017-10-31 DIAGNOSIS — G809 Cerebral palsy, unspecified: Secondary | ICD-10-CM | POA: Diagnosis not present

## 2017-10-31 DIAGNOSIS — I509 Heart failure, unspecified: Secondary | ICD-10-CM | POA: Diagnosis not present

## 2017-10-31 DIAGNOSIS — Z6841 Body Mass Index (BMI) 40.0 and over, adult: Secondary | ICD-10-CM | POA: Diagnosis not present

## 2017-10-31 DIAGNOSIS — Z125 Encounter for screening for malignant neoplasm of prostate: Secondary | ICD-10-CM | POA: Diagnosis not present

## 2017-10-31 DIAGNOSIS — I10 Essential (primary) hypertension: Secondary | ICD-10-CM | POA: Diagnosis not present

## 2017-10-31 DIAGNOSIS — Z79899 Other long term (current) drug therapy: Secondary | ICD-10-CM | POA: Diagnosis not present

## 2017-10-31 DIAGNOSIS — Z299 Encounter for prophylactic measures, unspecified: Secondary | ICD-10-CM | POA: Diagnosis not present

## 2017-10-31 DIAGNOSIS — I428 Other cardiomyopathies: Secondary | ICD-10-CM | POA: Diagnosis not present

## 2017-10-31 DIAGNOSIS — R5383 Other fatigue: Secondary | ICD-10-CM | POA: Diagnosis not present

## 2017-12-04 DIAGNOSIS — E7800 Pure hypercholesterolemia, unspecified: Secondary | ICD-10-CM | POA: Diagnosis not present

## 2017-12-04 DIAGNOSIS — Z299 Encounter for prophylactic measures, unspecified: Secondary | ICD-10-CM | POA: Diagnosis not present

## 2017-12-04 DIAGNOSIS — Z1331 Encounter for screening for depression: Secondary | ICD-10-CM | POA: Diagnosis not present

## 2017-12-04 DIAGNOSIS — Z1339 Encounter for screening examination for other mental health and behavioral disorders: Secondary | ICD-10-CM | POA: Diagnosis not present

## 2017-12-04 DIAGNOSIS — I509 Heart failure, unspecified: Secondary | ICD-10-CM | POA: Diagnosis not present

## 2017-12-04 DIAGNOSIS — Z7189 Other specified counseling: Secondary | ICD-10-CM | POA: Diagnosis not present

## 2017-12-04 DIAGNOSIS — Z6841 Body Mass Index (BMI) 40.0 and over, adult: Secondary | ICD-10-CM | POA: Diagnosis not present

## 2017-12-04 DIAGNOSIS — Z1211 Encounter for screening for malignant neoplasm of colon: Secondary | ICD-10-CM | POA: Diagnosis not present

## 2017-12-04 DIAGNOSIS — Z0000 Encounter for general adult medical examination without abnormal findings: Secondary | ICD-10-CM | POA: Diagnosis not present

## 2018-01-24 DIAGNOSIS — Z6841 Body Mass Index (BMI) 40.0 and over, adult: Secondary | ICD-10-CM | POA: Diagnosis not present

## 2018-01-24 DIAGNOSIS — Z299 Encounter for prophylactic measures, unspecified: Secondary | ICD-10-CM | POA: Diagnosis not present

## 2018-01-24 DIAGNOSIS — I10 Essential (primary) hypertension: Secondary | ICD-10-CM | POA: Diagnosis not present

## 2018-01-24 DIAGNOSIS — E1165 Type 2 diabetes mellitus with hyperglycemia: Secondary | ICD-10-CM | POA: Diagnosis not present

## 2018-01-24 DIAGNOSIS — I509 Heart failure, unspecified: Secondary | ICD-10-CM | POA: Diagnosis not present

## 2018-10-07 DIAGNOSIS — Z6841 Body Mass Index (BMI) 40.0 and over, adult: Secondary | ICD-10-CM | POA: Diagnosis not present

## 2018-10-07 DIAGNOSIS — I10 Essential (primary) hypertension: Secondary | ICD-10-CM | POA: Diagnosis not present

## 2018-10-07 DIAGNOSIS — E1165 Type 2 diabetes mellitus with hyperglycemia: Secondary | ICD-10-CM | POA: Diagnosis not present

## 2018-10-07 DIAGNOSIS — Z299 Encounter for prophylactic measures, unspecified: Secondary | ICD-10-CM | POA: Diagnosis not present

## 2018-10-07 DIAGNOSIS — F17210 Nicotine dependence, cigarettes, uncomplicated: Secondary | ICD-10-CM | POA: Diagnosis not present

## 2018-10-07 DIAGNOSIS — G809 Cerebral palsy, unspecified: Secondary | ICD-10-CM | POA: Diagnosis not present

## 2018-10-07 DIAGNOSIS — R69 Illness, unspecified: Secondary | ICD-10-CM | POA: Diagnosis not present

## 2018-10-17 DIAGNOSIS — Z299 Encounter for prophylactic measures, unspecified: Secondary | ICD-10-CM | POA: Diagnosis not present

## 2018-10-17 DIAGNOSIS — I509 Heart failure, unspecified: Secondary | ICD-10-CM | POA: Diagnosis not present

## 2018-10-17 DIAGNOSIS — I10 Essential (primary) hypertension: Secondary | ICD-10-CM | POA: Diagnosis not present

## 2018-10-17 DIAGNOSIS — I428 Other cardiomyopathies: Secondary | ICD-10-CM | POA: Diagnosis not present

## 2018-10-17 DIAGNOSIS — E1165 Type 2 diabetes mellitus with hyperglycemia: Secondary | ICD-10-CM | POA: Diagnosis not present

## 2018-10-17 DIAGNOSIS — Z6841 Body Mass Index (BMI) 40.0 and over, adult: Secondary | ICD-10-CM | POA: Diagnosis not present

## 2018-11-06 DIAGNOSIS — Z299 Encounter for prophylactic measures, unspecified: Secondary | ICD-10-CM | POA: Diagnosis not present

## 2018-11-06 DIAGNOSIS — R69 Illness, unspecified: Secondary | ICD-10-CM | POA: Diagnosis not present

## 2018-11-06 DIAGNOSIS — Z6841 Body Mass Index (BMI) 40.0 and over, adult: Secondary | ICD-10-CM | POA: Diagnosis not present

## 2018-11-06 DIAGNOSIS — E1165 Type 2 diabetes mellitus with hyperglycemia: Secondary | ICD-10-CM | POA: Diagnosis not present

## 2018-11-06 DIAGNOSIS — I10 Essential (primary) hypertension: Secondary | ICD-10-CM | POA: Diagnosis not present

## 2018-11-06 DIAGNOSIS — F17210 Nicotine dependence, cigarettes, uncomplicated: Secondary | ICD-10-CM | POA: Diagnosis not present

## 2018-11-06 DIAGNOSIS — E119 Type 2 diabetes mellitus without complications: Secondary | ICD-10-CM | POA: Diagnosis not present

## 2018-12-04 DIAGNOSIS — E119 Type 2 diabetes mellitus without complications: Secondary | ICD-10-CM | POA: Diagnosis not present

## 2018-12-11 DIAGNOSIS — Z0000 Encounter for general adult medical examination without abnormal findings: Secondary | ICD-10-CM | POA: Diagnosis not present

## 2018-12-11 DIAGNOSIS — R5383 Other fatigue: Secondary | ICD-10-CM | POA: Diagnosis not present

## 2018-12-11 DIAGNOSIS — Z1331 Encounter for screening for depression: Secondary | ICD-10-CM | POA: Diagnosis not present

## 2018-12-11 DIAGNOSIS — I10 Essential (primary) hypertension: Secondary | ICD-10-CM | POA: Diagnosis not present

## 2018-12-11 DIAGNOSIS — Z299 Encounter for prophylactic measures, unspecified: Secondary | ICD-10-CM | POA: Diagnosis not present

## 2018-12-11 DIAGNOSIS — E1165 Type 2 diabetes mellitus with hyperglycemia: Secondary | ICD-10-CM | POA: Diagnosis not present

## 2018-12-11 DIAGNOSIS — Z1211 Encounter for screening for malignant neoplasm of colon: Secondary | ICD-10-CM | POA: Diagnosis not present

## 2018-12-11 DIAGNOSIS — Z6841 Body Mass Index (BMI) 40.0 and over, adult: Secondary | ICD-10-CM | POA: Diagnosis not present

## 2018-12-11 DIAGNOSIS — E7800 Pure hypercholesterolemia, unspecified: Secondary | ICD-10-CM | POA: Diagnosis not present

## 2018-12-11 DIAGNOSIS — Z1339 Encounter for screening examination for other mental health and behavioral disorders: Secondary | ICD-10-CM | POA: Diagnosis not present

## 2018-12-11 DIAGNOSIS — Z7189 Other specified counseling: Secondary | ICD-10-CM | POA: Diagnosis not present

## 2019-01-01 DIAGNOSIS — R69 Illness, unspecified: Secondary | ICD-10-CM | POA: Diagnosis not present

## 2019-01-01 DIAGNOSIS — I509 Heart failure, unspecified: Secondary | ICD-10-CM | POA: Diagnosis not present

## 2019-01-01 DIAGNOSIS — F17210 Nicotine dependence, cigarettes, uncomplicated: Secondary | ICD-10-CM | POA: Diagnosis not present

## 2019-01-01 DIAGNOSIS — Z299 Encounter for prophylactic measures, unspecified: Secondary | ICD-10-CM | POA: Diagnosis not present

## 2019-01-01 DIAGNOSIS — I10 Essential (primary) hypertension: Secondary | ICD-10-CM | POA: Diagnosis not present

## 2019-01-01 DIAGNOSIS — Z6839 Body mass index (BMI) 39.0-39.9, adult: Secondary | ICD-10-CM | POA: Diagnosis not present

## 2019-01-01 DIAGNOSIS — E1165 Type 2 diabetes mellitus with hyperglycemia: Secondary | ICD-10-CM | POA: Diagnosis not present

## 2019-01-01 DIAGNOSIS — I428 Other cardiomyopathies: Secondary | ICD-10-CM | POA: Diagnosis not present

## 2019-01-06 DIAGNOSIS — E119 Type 2 diabetes mellitus without complications: Secondary | ICD-10-CM | POA: Diagnosis not present

## 2019-02-02 DIAGNOSIS — I10 Essential (primary) hypertension: Secondary | ICD-10-CM | POA: Diagnosis not present

## 2019-02-02 DIAGNOSIS — R69 Illness, unspecified: Secondary | ICD-10-CM | POA: Diagnosis not present

## 2019-02-02 DIAGNOSIS — F17210 Nicotine dependence, cigarettes, uncomplicated: Secondary | ICD-10-CM | POA: Diagnosis not present

## 2019-02-02 DIAGNOSIS — E119 Type 2 diabetes mellitus without complications: Secondary | ICD-10-CM | POA: Diagnosis not present

## 2019-02-02 DIAGNOSIS — G809 Cerebral palsy, unspecified: Secondary | ICD-10-CM | POA: Diagnosis not present

## 2019-02-02 DIAGNOSIS — I509 Heart failure, unspecified: Secondary | ICD-10-CM | POA: Diagnosis not present

## 2019-02-02 DIAGNOSIS — Z299 Encounter for prophylactic measures, unspecified: Secondary | ICD-10-CM | POA: Diagnosis not present

## 2019-02-02 DIAGNOSIS — Z6841 Body Mass Index (BMI) 40.0 and over, adult: Secondary | ICD-10-CM | POA: Diagnosis not present

## 2019-02-02 DIAGNOSIS — E1165 Type 2 diabetes mellitus with hyperglycemia: Secondary | ICD-10-CM | POA: Diagnosis not present

## 2019-03-09 DIAGNOSIS — E119 Type 2 diabetes mellitus without complications: Secondary | ICD-10-CM | POA: Diagnosis not present

## 2019-03-23 DIAGNOSIS — G809 Cerebral palsy, unspecified: Secondary | ICD-10-CM | POA: Diagnosis not present

## 2019-03-23 DIAGNOSIS — Z299 Encounter for prophylactic measures, unspecified: Secondary | ICD-10-CM | POA: Diagnosis not present

## 2019-03-23 DIAGNOSIS — F17210 Nicotine dependence, cigarettes, uncomplicated: Secondary | ICD-10-CM | POA: Diagnosis not present

## 2019-03-23 DIAGNOSIS — I10 Essential (primary) hypertension: Secondary | ICD-10-CM | POA: Diagnosis not present

## 2019-03-23 DIAGNOSIS — I509 Heart failure, unspecified: Secondary | ICD-10-CM | POA: Diagnosis not present

## 2019-03-23 DIAGNOSIS — I428 Other cardiomyopathies: Secondary | ICD-10-CM | POA: Diagnosis not present

## 2019-03-23 DIAGNOSIS — Z2821 Immunization not carried out because of patient refusal: Secondary | ICD-10-CM | POA: Diagnosis not present

## 2019-03-23 DIAGNOSIS — Z6841 Body Mass Index (BMI) 40.0 and over, adult: Secondary | ICD-10-CM | POA: Diagnosis not present

## 2019-03-23 DIAGNOSIS — R69 Illness, unspecified: Secondary | ICD-10-CM | POA: Diagnosis not present

## 2019-03-23 DIAGNOSIS — E1165 Type 2 diabetes mellitus with hyperglycemia: Secondary | ICD-10-CM | POA: Diagnosis not present

## 2019-04-01 DIAGNOSIS — E119 Type 2 diabetes mellitus without complications: Secondary | ICD-10-CM | POA: Diagnosis not present

## 2019-04-29 DIAGNOSIS — I509 Heart failure, unspecified: Secondary | ICD-10-CM | POA: Diagnosis not present

## 2019-04-29 DIAGNOSIS — I10 Essential (primary) hypertension: Secondary | ICD-10-CM | POA: Diagnosis not present

## 2019-04-29 DIAGNOSIS — E1165 Type 2 diabetes mellitus with hyperglycemia: Secondary | ICD-10-CM | POA: Diagnosis not present

## 2019-04-29 DIAGNOSIS — Z6841 Body Mass Index (BMI) 40.0 and over, adult: Secondary | ICD-10-CM | POA: Diagnosis not present

## 2019-04-29 DIAGNOSIS — R69 Illness, unspecified: Secondary | ICD-10-CM | POA: Diagnosis not present

## 2019-04-29 DIAGNOSIS — Z299 Encounter for prophylactic measures, unspecified: Secondary | ICD-10-CM | POA: Diagnosis not present

## 2019-04-29 DIAGNOSIS — G809 Cerebral palsy, unspecified: Secondary | ICD-10-CM | POA: Diagnosis not present

## 2019-05-06 DIAGNOSIS — E119 Type 2 diabetes mellitus without complications: Secondary | ICD-10-CM | POA: Diagnosis not present

## 2019-06-01 DIAGNOSIS — G809 Cerebral palsy, unspecified: Secondary | ICD-10-CM | POA: Diagnosis not present

## 2019-06-01 DIAGNOSIS — E1165 Type 2 diabetes mellitus with hyperglycemia: Secondary | ICD-10-CM | POA: Diagnosis not present

## 2019-06-01 DIAGNOSIS — I10 Essential (primary) hypertension: Secondary | ICD-10-CM | POA: Diagnosis not present

## 2019-06-01 DIAGNOSIS — I509 Heart failure, unspecified: Secondary | ICD-10-CM | POA: Diagnosis not present

## 2019-06-01 DIAGNOSIS — Z6841 Body Mass Index (BMI) 40.0 and over, adult: Secondary | ICD-10-CM | POA: Diagnosis not present

## 2019-06-01 DIAGNOSIS — Z299 Encounter for prophylactic measures, unspecified: Secondary | ICD-10-CM | POA: Diagnosis not present

## 2019-06-01 DIAGNOSIS — I428 Other cardiomyopathies: Secondary | ICD-10-CM | POA: Diagnosis not present

## 2019-06-07 DIAGNOSIS — E119 Type 2 diabetes mellitus without complications: Secondary | ICD-10-CM | POA: Diagnosis not present

## 2019-06-30 DIAGNOSIS — M25559 Pain in unspecified hip: Secondary | ICD-10-CM | POA: Diagnosis not present

## 2019-06-30 DIAGNOSIS — Z6841 Body Mass Index (BMI) 40.0 and over, adult: Secondary | ICD-10-CM | POA: Diagnosis not present

## 2019-06-30 DIAGNOSIS — I10 Essential (primary) hypertension: Secondary | ICD-10-CM | POA: Diagnosis not present

## 2019-06-30 DIAGNOSIS — E1165 Type 2 diabetes mellitus with hyperglycemia: Secondary | ICD-10-CM | POA: Diagnosis not present

## 2019-06-30 DIAGNOSIS — Z299 Encounter for prophylactic measures, unspecified: Secondary | ICD-10-CM | POA: Diagnosis not present

## 2019-06-30 DIAGNOSIS — I509 Heart failure, unspecified: Secondary | ICD-10-CM | POA: Diagnosis not present

## 2019-07-07 DIAGNOSIS — E119 Type 2 diabetes mellitus without complications: Secondary | ICD-10-CM | POA: Diagnosis not present

## 2019-08-03 DIAGNOSIS — I509 Heart failure, unspecified: Secondary | ICD-10-CM | POA: Diagnosis not present

## 2019-08-03 DIAGNOSIS — E1165 Type 2 diabetes mellitus with hyperglycemia: Secondary | ICD-10-CM | POA: Diagnosis not present

## 2019-08-03 DIAGNOSIS — Z299 Encounter for prophylactic measures, unspecified: Secondary | ICD-10-CM | POA: Diagnosis not present

## 2019-08-03 DIAGNOSIS — N189 Chronic kidney disease, unspecified: Secondary | ICD-10-CM | POA: Diagnosis not present

## 2019-08-03 DIAGNOSIS — I428 Other cardiomyopathies: Secondary | ICD-10-CM | POA: Diagnosis not present

## 2019-08-03 DIAGNOSIS — R0902 Hypoxemia: Secondary | ICD-10-CM | POA: Diagnosis not present

## 2019-08-03 DIAGNOSIS — Z7982 Long term (current) use of aspirin: Secondary | ICD-10-CM | POA: Diagnosis not present

## 2019-08-03 DIAGNOSIS — I130 Hypertensive heart and chronic kidney disease with heart failure and stage 1 through stage 4 chronic kidney disease, or unspecified chronic kidney disease: Secondary | ICD-10-CM | POA: Diagnosis not present

## 2019-08-03 DIAGNOSIS — E1122 Type 2 diabetes mellitus with diabetic chronic kidney disease: Secondary | ICD-10-CM | POA: Diagnosis not present

## 2019-08-03 DIAGNOSIS — J9601 Acute respiratory failure with hypoxia: Secondary | ICD-10-CM | POA: Diagnosis not present

## 2019-08-03 DIAGNOSIS — J1282 Pneumonia due to coronavirus disease 2019: Secondary | ICD-10-CM | POA: Diagnosis not present

## 2019-08-03 DIAGNOSIS — R05 Cough: Secondary | ICD-10-CM | POA: Diagnosis not present

## 2019-08-03 DIAGNOSIS — U071 COVID-19: Secondary | ICD-10-CM | POA: Diagnosis not present

## 2019-08-03 DIAGNOSIS — J1289 Other viral pneumonia: Secondary | ICD-10-CM | POA: Diagnosis not present

## 2019-08-03 DIAGNOSIS — R69 Illness, unspecified: Secondary | ICD-10-CM | POA: Diagnosis not present

## 2019-08-07 DIAGNOSIS — E119 Type 2 diabetes mellitus without complications: Secondary | ICD-10-CM | POA: Diagnosis not present

## 2019-08-20 DIAGNOSIS — Z299 Encounter for prophylactic measures, unspecified: Secondary | ICD-10-CM | POA: Diagnosis not present

## 2019-08-20 DIAGNOSIS — I509 Heart failure, unspecified: Secondary | ICD-10-CM | POA: Diagnosis not present

## 2019-08-20 DIAGNOSIS — Z8616 Personal history of COVID-19: Secondary | ICD-10-CM | POA: Diagnosis not present

## 2019-08-20 DIAGNOSIS — E1165 Type 2 diabetes mellitus with hyperglycemia: Secondary | ICD-10-CM | POA: Diagnosis not present

## 2019-08-20 DIAGNOSIS — Z6841 Body Mass Index (BMI) 40.0 and over, adult: Secondary | ICD-10-CM | POA: Diagnosis not present

## 2019-08-20 DIAGNOSIS — R69 Illness, unspecified: Secondary | ICD-10-CM | POA: Diagnosis not present

## 2019-09-06 DIAGNOSIS — E119 Type 2 diabetes mellitus without complications: Secondary | ICD-10-CM | POA: Diagnosis not present

## 2019-10-07 DIAGNOSIS — E119 Type 2 diabetes mellitus without complications: Secondary | ICD-10-CM | POA: Diagnosis not present

## 2019-11-06 DIAGNOSIS — E119 Type 2 diabetes mellitus without complications: Secondary | ICD-10-CM | POA: Diagnosis not present

## 2019-12-08 DIAGNOSIS — E119 Type 2 diabetes mellitus without complications: Secondary | ICD-10-CM | POA: Diagnosis not present

## 2019-12-11 DIAGNOSIS — I10 Essential (primary) hypertension: Secondary | ICD-10-CM | POA: Diagnosis not present

## 2019-12-11 DIAGNOSIS — E1165 Type 2 diabetes mellitus with hyperglycemia: Secondary | ICD-10-CM | POA: Diagnosis not present

## 2019-12-11 DIAGNOSIS — Z6841 Body Mass Index (BMI) 40.0 and over, adult: Secondary | ICD-10-CM | POA: Diagnosis not present

## 2019-12-11 DIAGNOSIS — Z299 Encounter for prophylactic measures, unspecified: Secondary | ICD-10-CM | POA: Diagnosis not present

## 2019-12-11 DIAGNOSIS — I509 Heart failure, unspecified: Secondary | ICD-10-CM | POA: Diagnosis not present

## 2019-12-11 DIAGNOSIS — R69 Illness, unspecified: Secondary | ICD-10-CM | POA: Diagnosis not present

## 2020-01-07 DIAGNOSIS — E119 Type 2 diabetes mellitus without complications: Secondary | ICD-10-CM | POA: Diagnosis not present

## 2020-02-06 DIAGNOSIS — E119 Type 2 diabetes mellitus without complications: Secondary | ICD-10-CM | POA: Diagnosis not present

## 2020-03-08 DIAGNOSIS — E119 Type 2 diabetes mellitus without complications: Secondary | ICD-10-CM | POA: Diagnosis not present

## 2020-04-07 DIAGNOSIS — E119 Type 2 diabetes mellitus without complications: Secondary | ICD-10-CM | POA: Diagnosis not present

## 2020-09-06 DIAGNOSIS — E119 Type 2 diabetes mellitus without complications: Secondary | ICD-10-CM | POA: Diagnosis not present

## 2020-10-06 DIAGNOSIS — E119 Type 2 diabetes mellitus without complications: Secondary | ICD-10-CM | POA: Diagnosis not present

## 2020-10-14 DIAGNOSIS — E1165 Type 2 diabetes mellitus with hyperglycemia: Secondary | ICD-10-CM | POA: Diagnosis not present

## 2020-10-14 DIAGNOSIS — I10 Essential (primary) hypertension: Secondary | ICD-10-CM | POA: Diagnosis not present

## 2020-10-14 DIAGNOSIS — Z299 Encounter for prophylactic measures, unspecified: Secondary | ICD-10-CM | POA: Diagnosis not present

## 2020-10-14 DIAGNOSIS — I509 Heart failure, unspecified: Secondary | ICD-10-CM | POA: Diagnosis not present

## 2020-11-04 DIAGNOSIS — E119 Type 2 diabetes mellitus without complications: Secondary | ICD-10-CM | POA: Diagnosis not present

## 2020-11-14 DIAGNOSIS — Z299 Encounter for prophylactic measures, unspecified: Secondary | ICD-10-CM | POA: Diagnosis not present

## 2020-11-14 DIAGNOSIS — I10 Essential (primary) hypertension: Secondary | ICD-10-CM | POA: Diagnosis not present

## 2020-11-14 DIAGNOSIS — E1165 Type 2 diabetes mellitus with hyperglycemia: Secondary | ICD-10-CM | POA: Diagnosis not present

## 2020-11-14 DIAGNOSIS — I428 Other cardiomyopathies: Secondary | ICD-10-CM | POA: Diagnosis not present

## 2020-12-07 DIAGNOSIS — E7800 Pure hypercholesterolemia, unspecified: Secondary | ICD-10-CM | POA: Diagnosis not present

## 2020-12-07 DIAGNOSIS — F17210 Nicotine dependence, cigarettes, uncomplicated: Secondary | ICD-10-CM | POA: Diagnosis not present

## 2020-12-07 DIAGNOSIS — I10 Essential (primary) hypertension: Secondary | ICD-10-CM | POA: Diagnosis not present

## 2020-12-07 DIAGNOSIS — Z7189 Other specified counseling: Secondary | ICD-10-CM | POA: Diagnosis not present

## 2020-12-07 DIAGNOSIS — E119 Type 2 diabetes mellitus without complications: Secondary | ICD-10-CM | POA: Diagnosis not present

## 2020-12-07 DIAGNOSIS — Z299 Encounter for prophylactic measures, unspecified: Secondary | ICD-10-CM | POA: Diagnosis not present

## 2020-12-07 DIAGNOSIS — Z79899 Other long term (current) drug therapy: Secondary | ICD-10-CM | POA: Diagnosis not present

## 2020-12-07 DIAGNOSIS — Z0000 Encounter for general adult medical examination without abnormal findings: Secondary | ICD-10-CM | POA: Diagnosis not present

## 2020-12-07 DIAGNOSIS — R5383 Other fatigue: Secondary | ICD-10-CM | POA: Diagnosis not present

## 2020-12-08 DIAGNOSIS — E119 Type 2 diabetes mellitus without complications: Secondary | ICD-10-CM | POA: Diagnosis not present

## 2020-12-08 DIAGNOSIS — H52223 Regular astigmatism, bilateral: Secondary | ICD-10-CM | POA: Diagnosis not present

## 2020-12-08 DIAGNOSIS — Z7984 Long term (current) use of oral hypoglycemic drugs: Secondary | ICD-10-CM | POA: Diagnosis not present

## 2020-12-08 DIAGNOSIS — H2513 Age-related nuclear cataract, bilateral: Secondary | ICD-10-CM | POA: Diagnosis not present

## 2020-12-08 DIAGNOSIS — H524 Presbyopia: Secondary | ICD-10-CM | POA: Diagnosis not present

## 2020-12-08 DIAGNOSIS — Z794 Long term (current) use of insulin: Secondary | ICD-10-CM | POA: Diagnosis not present

## 2020-12-08 DIAGNOSIS — H5213 Myopia, bilateral: Secondary | ICD-10-CM | POA: Diagnosis not present

## 2020-12-13 DIAGNOSIS — Z800 Family history of malignant neoplasm of digestive organs: Secondary | ICD-10-CM | POA: Diagnosis not present

## 2020-12-13 DIAGNOSIS — Z85038 Personal history of other malignant neoplasm of large intestine: Secondary | ICD-10-CM | POA: Diagnosis not present

## 2021-01-02 DIAGNOSIS — K639 Disease of intestine, unspecified: Secondary | ICD-10-CM | POA: Diagnosis not present

## 2021-01-02 DIAGNOSIS — Z880 Allergy status to penicillin: Secondary | ICD-10-CM | POA: Diagnosis not present

## 2021-01-02 DIAGNOSIS — I10 Essential (primary) hypertension: Secondary | ICD-10-CM | POA: Diagnosis not present

## 2021-01-02 DIAGNOSIS — Z85038 Personal history of other malignant neoplasm of large intestine: Secondary | ICD-10-CM | POA: Diagnosis not present

## 2021-01-02 DIAGNOSIS — D127 Benign neoplasm of rectosigmoid junction: Secondary | ICD-10-CM | POA: Diagnosis not present

## 2021-01-02 DIAGNOSIS — E119 Type 2 diabetes mellitus without complications: Secondary | ICD-10-CM | POA: Diagnosis not present

## 2021-01-02 DIAGNOSIS — F17210 Nicotine dependence, cigarettes, uncomplicated: Secondary | ICD-10-CM | POA: Diagnosis not present

## 2021-01-02 DIAGNOSIS — Z7984 Long term (current) use of oral hypoglycemic drugs: Secondary | ICD-10-CM | POA: Diagnosis not present

## 2021-01-02 DIAGNOSIS — D126 Benign neoplasm of colon, unspecified: Secondary | ICD-10-CM | POA: Diagnosis not present

## 2021-01-02 DIAGNOSIS — Z1211 Encounter for screening for malignant neoplasm of colon: Secondary | ICD-10-CM | POA: Diagnosis not present

## 2021-01-02 DIAGNOSIS — K5730 Diverticulosis of large intestine without perforation or abscess without bleeding: Secondary | ICD-10-CM | POA: Diagnosis not present

## 2021-01-02 DIAGNOSIS — D125 Benign neoplasm of sigmoid colon: Secondary | ICD-10-CM | POA: Diagnosis not present

## 2021-01-02 DIAGNOSIS — K635 Polyp of colon: Secondary | ICD-10-CM | POA: Diagnosis not present

## 2021-01-02 DIAGNOSIS — Z7982 Long term (current) use of aspirin: Secondary | ICD-10-CM | POA: Diagnosis not present

## 2021-01-06 DIAGNOSIS — E119 Type 2 diabetes mellitus without complications: Secondary | ICD-10-CM | POA: Diagnosis not present

## 2021-01-12 DIAGNOSIS — Z299 Encounter for prophylactic measures, unspecified: Secondary | ICD-10-CM | POA: Diagnosis not present

## 2021-01-12 DIAGNOSIS — I10 Essential (primary) hypertension: Secondary | ICD-10-CM | POA: Diagnosis not present

## 2021-01-12 DIAGNOSIS — I509 Heart failure, unspecified: Secondary | ICD-10-CM | POA: Diagnosis not present

## 2021-01-12 DIAGNOSIS — F17210 Nicotine dependence, cigarettes, uncomplicated: Secondary | ICD-10-CM | POA: Diagnosis not present

## 2021-01-17 DIAGNOSIS — K635 Polyp of colon: Secondary | ICD-10-CM | POA: Diagnosis not present

## 2021-01-17 DIAGNOSIS — Z800 Family history of malignant neoplasm of digestive organs: Secondary | ICD-10-CM | POA: Diagnosis not present

## 2021-01-17 DIAGNOSIS — Z85038 Personal history of other malignant neoplasm of large intestine: Secondary | ICD-10-CM | POA: Diagnosis not present

## 2021-01-26 ENCOUNTER — Ambulatory Visit (INDEPENDENT_AMBULATORY_CARE_PROVIDER_SITE_OTHER): Payer: Medicare Other | Admitting: Cardiology

## 2021-01-26 ENCOUNTER — Other Ambulatory Visit: Payer: Self-pay

## 2021-01-26 ENCOUNTER — Other Ambulatory Visit (HOSPITAL_COMMUNITY)
Admission: RE | Admit: 2021-01-26 | Discharge: 2021-01-26 | Disposition: A | Discharge status: Home / Self Care | Payer: Medicare Other | Source: Ambulatory Visit | Attending: Cardiology | Admitting: Cardiology

## 2021-01-26 ENCOUNTER — Encounter: Payer: Self-pay | Admitting: Cardiology

## 2021-01-26 VITALS — BP 134/98 | HR 69 | Ht 65.0 in | Wt 248.6 lb

## 2021-01-26 DIAGNOSIS — I10 Essential (primary) hypertension: Secondary | ICD-10-CM | POA: Diagnosis not present

## 2021-01-26 DIAGNOSIS — I429 Cardiomyopathy, unspecified: Secondary | ICD-10-CM | POA: Diagnosis not present

## 2021-01-26 DIAGNOSIS — G4733 Obstructive sleep apnea (adult) (pediatric): Secondary | ICD-10-CM | POA: Diagnosis not present

## 2021-01-26 LAB — BASIC METABOLIC PANEL
Anion gap: 5 (ref 5–15)
BUN: 28 mg/dL — ABNORMAL HIGH (ref 8–23)
CO2: 28 mmol/L (ref 22–32)
Calcium: 10.4 mg/dL — ABNORMAL HIGH (ref 8.9–10.3)
Chloride: 106 mmol/L (ref 98–111)
Creatinine, Ser: 1.26 mg/dL — ABNORMAL HIGH (ref 0.61–1.24)
GFR, Estimated: >60 mL/min (ref >60)
Glucose, Bld: 201 mg/dL — ABNORMAL HIGH (ref 70–99)
Potassium: 5.5 mmol/L — ABNORMAL HIGH (ref 3.5–5.1)
Sodium: 139 mmol/L (ref 135–145)

## 2021-01-26 NOTE — Addendum Note (Signed)
Addended by: Antonieta Iba on: 01/26/2021 09:51 AM   Modules accepted: Orders

## 2021-01-26 NOTE — Patient Instructions (Addendum)
Medication Instructions:  Your physician recommends that you continue on your current medications as directed. Please refer to the Current Medication list given to you today.  *If you need a refill on your cardiac medications before your next appointment, please call your pharmacy*   Lab Work: TODAY: BMET If you have labs (blood work) drawn today and your tests are completely normal, you will receive your results only by: [place] (if you have MyChart) OR A paper copy in the mail If you have any lab test that is abnormal or we need to change your treatment, we will call you to review the results.   Testing/Procedures: Your physician has recommended that you have a sleep study. This test records several body functions during sleep, including: brain activity, eye movement, oxygen and carbon dioxide blood levels, heart rate and rhythm, breathing rate and rhythm, the flow of air through your mouth and nose, snoring, body muscle movements, and chest and belly movement.  Your physician has recommended that you wear a 24 hour BP monitor.   Follow-Up: At [facility], you and your health needs are our priority.  As part of our continuing mission to provide you with exceptional heart care, we have created designated Provider Care Teams.  These Care Teams include your primary Cardiologist (physician) and Advanced Practice Providers (APPs -  Physician Assistants and Nurse Practitioners) who all work together to provide you with the care you need, when you need it.  Follow up as needed after testing.

## 2021-01-26 NOTE — Progress Notes (Signed)
Cardiology CONSULT Note    Date:  01/26/2021   ID:  [name] [name], DOB 04-14-1955, MRN [id number]  PCP:  Monico Blitz, MD  Cardiologist:  Fransico Him, MD   Chief Complaint  Patient presents with   Cardiomyopathy   Hypertension   Sleep Apnea     History of Present Illness:  [name] [name] is a 65 y.o. male who is being seen today to reestablish cardiac care after being lost to followup in 2018 at the request of Monico Blitz, MD.  This is a 65yo AAM with a hx of colon CA, NICM  (Normal coronary arteries by cath 2005 with EF initially 15% in 2004 and normalized by echo 2015) with chronic combined systolic/diastolic CHF, HTN, morbid obesity, DM and OSA not on CPAP.  2D echo in 2015 showed normalization of LV dysfunction with EF 55% with basal inferolateral HK, G1DD and mild TR.  He has a hx of OSA and was initially on CPAP in 2005 but he said that he was sleeping well so he stopped CPAP on his own.    He is doing well.  He tells me that a few months ago he started taking Insulin and then would wake up in the middle of the night with CP and so he stopped the insulin and it resolved.  He denies any SOB, DOE, PND, orthopnea, LE edema, dizziness, palpitations or syncope. He is compliant with his meds and is tolerating meds with no SE.     Past Medical History:  Diagnosis Date   Arthritis    Cancer ([place]) 04/09/2005   colon cancer   Chronic diastolic CHF (congestive heart failure) ([place])    EF initially 15% in 2005 with normal coronary arteries on cath with normalizatino of EF to 55% on echo 2015   Coronary artery disease    HTN (hypertension)    Morbid obesity ([facility])    NICM (nonischemic cardiomyopathy) ([place])    EF initially 15% in 2005 with normal coronary arteries on cath with normalizatino of EF to 55% on echo 2015   OSA (obstructive sleep apnea)    does not use CPAP    Past Surgical History:  Procedure Laterality Date   COLON SURGERY  2007   surgery for colon cancer    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   chlorthalidone (HYGROTON) 25 MG tablet Take 25 mg by mouth every morning.    diclofenac (VOLTAREN) 75 MG EC tablet Take 75 mg by mouth 2 (two) times daily.   lisinopril (PRINIVIL,ZESTRIL) 40 MG tablet Take 40 mg by mouth every morning.    metFORMIN (GLUCOPHAGE) 500 MG tablet Take 500 mg by mouth 2 (two) times daily.    Allergies:   Penicillins   Social History   Socioeconomic History   Marital status: Single    Spouse name: Not on file   Number of children: Not on file   Years of education:  Not on file   Highest education level: Not on file  Occupational History   Not on file  Tobacco Use   Smoking status: Every Day    Packs/day: 0.25    Years: 40.00    Pack years: 10.00    Types: Cigarettes    Start date: 08/01/1974   Smokeless tobacco: Never  Substance and Sexual Activity   Alcohol use: No   Drug use: No   Sexual activity: Not on file  Other Topics Concern   Not on file  Social History Narrative   Disabled.    Social Determinants of Health   Financial Resource Strain: Not on file  Food Insecurity: Not on file  Transportation Needs: Not on file  Physical Activity: Not on file  Stress: Not on file  Social Connections: Not on file     Family History:  The patient's family history includes Cancer in his mother; Hypertension in his father.   ROS:   Please see the history of  present illness.    ROS All other systems reviewed and are negative.  No flowsheet data found.     PHYSICAL EXAM:   VS:  BP (!) 134/98 (BP Location: Left Arm, Patient Position: Sitting, Cuff Size: Large)   Pulse 69   Ht 5\' 5"  (1.651 m)   Wt 248 lb 9.6 oz (112.8 kg)   SpO2 98%   BMI 41.37 kg/m    GEN: Well nourished, well developed, in no acute distress  HEENT: normal  Neck: no JVD, carotid bruits, or masses Cardiac: RRR; no murmurs, rubs, or gallops,no edema.  Intact distal pulses bilaterally.  Respiratory:  clear to auscultation bilaterally, normal work of breathing GI: soft, nontender, nondistended, + BS MS: no deformity or atrophy  Skin: warm and dry, no rash Neuro:  Alert and Oriented x 3, Strength and sensation are intact Psych: euthymic mood, full affect  Wt Readings from Last 3 Encounters:  01/26/21 248 lb 9.6 oz (112.8 kg)  03/05/17 236 lb (107 kg)  03/05/16 260 lb (117.9 kg)      Studies/Labs Reviewed:   EKG:  EKG is ordered today.  The ekg ordered today demonstrates NSR with nonspecific T wave abnormality  Recent Labs: No results found for requested labs within last 8760 hours.   Lipid Panel No results found for: CHOL, TRIG, HDL, CHOLHDL, VLDL, LDLCALC, LDLDIRECT   Additional studies/ records that were reviewed today include:  EKG and prior OV notes    ASSESSMENT:    1. Secondary cardiomyopathy ([place])   2. Essential hypertension   3. OSA (obstructive sleep apnea)      PLAN:  In order of problems listed above:  Nonischemic dilated cardiomyopathy -EF initially 15% in 2005 with normal coronary arteries on cath with normalization of EF to 55% on echo 2015 with mild LVH and hypokinesis of the basal inferior lateral wall with grade 1 diastolic dysfunction -He does not appear volume overloaded on exam today and has not had any shortness of breath or lower extremity edema -Continue prescription drug management with chlorthalidone 25 mg daily and  lisinopril 40 mg daily with as needed refills -Check bmet today  2.  Hypertension -Diastolic BP is high today>>he says that his BP at home is normal and has white coat HTN -Continue prescription drug management with lisinopril 40 mg daily, chlorthalidone 25 mg daily -check 48hr BP cuff  3.  Obstructive sleep apnea -He has not been using CPAP -he denies any excessive daytime sleepiness -This  may be contributing to his hypertension -I will send him to the sleep lab for a split-night sleep study  Time Spent: 25 minutes total time of encounter, including 15 minutes spent in face-to-face patient care on the date of this encounter. This time includes coordination of care and counseling regarding above mentioned problem list. Remainder of non-face-to-face time involved reviewing chart documents/testing relevant to the patient encounter and documentation in the medical record. I have independently reviewed documentation from referring provider  Medication Adjustments/Labs and Tests Ordered: Current medicines are reviewed at length with the patient today.  Concerns regarding medicines are outlined above.  Medication changes, Labs and Tests ordered today are listed in the Patient Instructions below.  There are no Patient Instructions on file for this visit.   Signed, Fransico Him, MD  01/26/2021 9:40 AM    [place] [place], [place],   [address] Phone: [phone]; Fax: [phone]

## 2021-02-01 ENCOUNTER — Telehealth: Payer: Self-pay | Admitting: *Deleted

## 2021-02-01 DIAGNOSIS — Z79899 Other long term (current) drug therapy: Secondary | ICD-10-CM

## 2021-02-01 MED ORDER — LISINOPRIL 20 MG PO TABS: 20.0000 mg | ORAL_TABLET | Freq: Every day | ORAL | 3 refills | Status: AC

## 2021-02-01 MED ORDER — CARVEDILOL 6.25 MG PO TABS: 6.2500 mg | ORAL_TABLET | Freq: Two times a day (BID) | ORAL | 3 refills | Status: DC

## 2021-02-01 NOTE — Telephone Encounter (Signed)
Pt notified and orders placed  

## 2021-02-01 NOTE — Telephone Encounter (Signed)
-----   Message from Sueanne Margarita, MD sent at 01/29/2021  9:45 PM EDT ----- Decrease Lisinopril to 20mg  daily and add Carvedilol 6.25mg  BID and check BP and HR daily for a week and call with results.  Repeat BMET on Wed 10/26 ----- Message ----- From: Levonne Hubert, LPN Sent: [id number]   5:09 PM EDT To: Sueanne Margarita, MD  Lab states that the sample was not hemolyzed.

## 2021-02-03 ENCOUNTER — Other Ambulatory Visit (HOSPITAL_COMMUNITY)
Admission: RE | Admit: 2021-02-03 | Discharge: 2021-02-03 | Disposition: A | Discharge status: Home / Self Care | Payer: Medicare Other | Source: Ambulatory Visit | Attending: Cardiology | Admitting: Cardiology

## 2021-02-03 DIAGNOSIS — Z79899 Other long term (current) drug therapy: Secondary | ICD-10-CM | POA: Insufficient documentation

## 2021-02-03 LAB — BASIC METABOLIC PANEL
Anion gap: 8 (ref 5–15)
BUN: 34 mg/dL — ABNORMAL HIGH (ref 8–23)
CO2: 25 mmol/L (ref 22–32)
Calcium: 9.1 mg/dL (ref 8.9–10.3)
Chloride: 104 mmol/L (ref 98–111)
Creatinine, Ser: 1.66 mg/dL — ABNORMAL HIGH (ref 0.61–1.24)
GFR, Estimated: 45 mL/min — ABNORMAL LOW (ref >60)
Glucose, Bld: 163 mg/dL — ABNORMAL HIGH (ref 70–99)
Potassium: 4.6 mmol/L (ref 3.5–5.1)
Sodium: 137 mmol/L (ref 135–145)

## 2021-02-06 ENCOUNTER — Telehealth: Payer: Self-pay | Admitting: *Deleted

## 2021-02-06 DIAGNOSIS — E119 Type 2 diabetes mellitus without complications: Secondary | ICD-10-CM | POA: Diagnosis not present

## 2021-02-06 DIAGNOSIS — I10 Essential (primary) hypertension: Secondary | ICD-10-CM

## 2021-02-06 MED ORDER — CARVEDILOL 12.5 MG PO TABS: 12.5000 mg | ORAL_TABLET | Freq: Two times a day (BID) | ORAL | 3 refills | Status: DC

## 2021-02-06 NOTE — Telephone Encounter (Signed)
Sueanne Margarita, MD  02/05/2021 11:30 AM EDT Back to Top    Disregard last order for starting amlodipine.  Patient is already on amlodipine 10 mg daily.  Serum creatinine actually increased instead of decreasing after dropping lisinopril back to 20 mg daily.  Voltaren and stop chlorthalidone.  Increase carvedilol to 12.5 mg twice daily since we are stopping chlorthalidone.  Please get a bmet on 02/08/2021.  These verify the patient did decrease his Zestril to 20 mg daily.  Also please make sure that patient is going to get the 48-hour blood pressure cuff    Pt notified and voiced understanding

## 2021-02-09 ENCOUNTER — Other Ambulatory Visit (HOSPITAL_COMMUNITY)
Admission: RE | Admit: 2021-02-09 | Discharge: 2021-02-09 | Disposition: A | Discharge status: Home / Self Care | Payer: Medicare Other | Source: Ambulatory Visit | Attending: Cardiology | Admitting: Cardiology

## 2021-02-09 ENCOUNTER — Other Ambulatory Visit: Payer: Self-pay

## 2021-02-09 DIAGNOSIS — I10 Essential (primary) hypertension: Secondary | ICD-10-CM | POA: Insufficient documentation

## 2021-02-09 LAB — BASIC METABOLIC PANEL
Anion gap: 4 — ABNORMAL LOW (ref 5–15)
BUN: 29 mg/dL — ABNORMAL HIGH (ref 8–23)
CO2: 28 mmol/L (ref 22–32)
Calcium: 8.8 mg/dL — ABNORMAL LOW (ref 8.9–10.3)
Chloride: 108 mmol/L (ref 98–111)
Creatinine, Ser: 1.38 mg/dL — ABNORMAL HIGH (ref 0.61–1.24)
GFR, Estimated: 56 mL/min — ABNORMAL LOW (ref >60)
Glucose, Bld: 220 mg/dL — ABNORMAL HIGH (ref 70–99)
Potassium: 4.6 mmol/L (ref 3.5–5.1)
Sodium: 140 mmol/L (ref 135–145)

## 2021-02-10 ENCOUNTER — Telehealth: Payer: Self-pay | Admitting: *Deleted

## 2021-02-10 NOTE — Telephone Encounter (Signed)
-----   Message from Lauralee Evener, [place] sent at 01/30/2021  8:44 AM EDT -----  ----- Message ----- From: Antonieta Iba, RN Sent: 01/26/2021   9:51 AM EDT To: [facility] Sleep Studies  Split night sleep study has been ordered.  Thanks!

## 2021-02-10 NOTE — Telephone Encounter (Signed)
Prior Authorization for split night sent to [facility] via web portal. Notification or Prior Authorization is not required for the requested services  Decision ID #:[id number]

## 2021-02-13 DIAGNOSIS — E1165 Type 2 diabetes mellitus with hyperglycemia: Secondary | ICD-10-CM | POA: Diagnosis not present

## 2021-02-13 DIAGNOSIS — Z23 Encounter for immunization: Secondary | ICD-10-CM | POA: Diagnosis not present

## 2021-02-13 DIAGNOSIS — I10 Essential (primary) hypertension: Secondary | ICD-10-CM | POA: Diagnosis not present

## 2021-02-13 DIAGNOSIS — G809 Cerebral palsy, unspecified: Secondary | ICD-10-CM | POA: Diagnosis not present

## 2021-02-13 DIAGNOSIS — M7552 Bursitis of left shoulder: Secondary | ICD-10-CM | POA: Diagnosis not present

## 2021-02-13 DIAGNOSIS — F17210 Nicotine dependence, cigarettes, uncomplicated: Secondary | ICD-10-CM | POA: Diagnosis not present

## 2021-02-13 DIAGNOSIS — Z299 Encounter for prophylactic measures, unspecified: Secondary | ICD-10-CM | POA: Diagnosis not present

## 2021-02-16 ENCOUNTER — Other Ambulatory Visit: Payer: Self-pay | Admitting: Cardiology

## 2021-02-16 ENCOUNTER — Telehealth: Payer: Self-pay | Admitting: *Deleted

## 2021-02-16 DIAGNOSIS — R030 Elevated blood-pressure reading, without diagnosis of hypertension: Secondary | ICD-10-CM

## 2021-02-16 DIAGNOSIS — I10 Essential (primary) hypertension: Secondary | ICD-10-CM

## 2021-02-16 NOTE — Telephone Encounter (Signed)
Appointment for 24 hour ambulatory blood pressure monitor scheduled for Monday, 02/27/21 at 2:00PM.  Date and address texted to patient.

## 2021-02-21 NOTE — Telephone Encounter (Signed)
Patient is scheduled for lab study on 03/13/21. Patient understands he sleep study will be done at [facility] sleep lab. Patient understands he will receive a sleep packet in a week or so. Patient understands to call if he does not receive the sleep packet in a timely manner. Patient agrees with treatment and thanked me for call.

## 2021-02-27 ENCOUNTER — Ambulatory Visit (INDEPENDENT_AMBULATORY_CARE_PROVIDER_SITE_OTHER): Payer: Medicare Other

## 2021-02-27 ENCOUNTER — Other Ambulatory Visit: Payer: Self-pay

## 2021-02-27 DIAGNOSIS — I10 Essential (primary) hypertension: Secondary | ICD-10-CM

## 2021-02-27 DIAGNOSIS — R030 Elevated blood-pressure reading, without diagnosis of hypertension: Secondary | ICD-10-CM | POA: Diagnosis not present

## 2021-02-27 NOTE — Progress Notes (Unsigned)
24 Hour ambulatory blood pressure monitor applied to patient using Large adult cuff size #3.

## 2021-03-08 DIAGNOSIS — E119 Type 2 diabetes mellitus without complications: Secondary | ICD-10-CM | POA: Diagnosis not present

## 2021-03-13 ENCOUNTER — Encounter: Payer: Medicare Other | Admitting: Cardiology

## 2021-03-21 ENCOUNTER — Telehealth: Payer: Self-pay

## 2021-03-21 NOTE — Telephone Encounter (Signed)
Letter has been sent to patient instructing them to call us if they are still interested in completing their sleep study. If we have not received a response from the patient within 30 days of this notice, the order will be cancelled and they will need to discuss the need for a sleep study at their next office visit.  ° °

## 2021-04-07 DIAGNOSIS — E119 Type 2 diabetes mellitus without complications: Secondary | ICD-10-CM | POA: Diagnosis not present

## 2021-05-07 DIAGNOSIS — E119 Type 2 diabetes mellitus without complications: Secondary | ICD-10-CM | POA: Diagnosis not present

## 2021-06-06 DIAGNOSIS — E119 Type 2 diabetes mellitus without complications: Secondary | ICD-10-CM | POA: Diagnosis not present

## 2021-06-07 DIAGNOSIS — I10 Essential (primary) hypertension: Secondary | ICD-10-CM | POA: Diagnosis not present

## 2021-06-07 DIAGNOSIS — I509 Heart failure, unspecified: Secondary | ICD-10-CM | POA: Diagnosis not present

## 2021-06-07 DIAGNOSIS — Z299 Encounter for prophylactic measures, unspecified: Secondary | ICD-10-CM | POA: Diagnosis not present

## 2021-06-07 DIAGNOSIS — E1165 Type 2 diabetes mellitus with hyperglycemia: Secondary | ICD-10-CM | POA: Diagnosis not present

## 2021-06-07 DIAGNOSIS — G809 Cerebral palsy, unspecified: Secondary | ICD-10-CM | POA: Diagnosis not present

## 2021-06-07 DIAGNOSIS — F17210 Nicotine dependence, cigarettes, uncomplicated: Secondary | ICD-10-CM | POA: Diagnosis not present

## 2021-06-19 DIAGNOSIS — J069 Acute upper respiratory infection, unspecified: Secondary | ICD-10-CM | POA: Diagnosis not present

## 2021-06-19 DIAGNOSIS — R0602 Shortness of breath: Secondary | ICD-10-CM | POA: Diagnosis not present

## 2021-06-19 DIAGNOSIS — I509 Heart failure, unspecified: Secondary | ICD-10-CM | POA: Diagnosis not present

## 2021-06-19 DIAGNOSIS — R059 Cough, unspecified: Secondary | ICD-10-CM | POA: Diagnosis not present

## 2021-06-19 DIAGNOSIS — R0989 Other specified symptoms and signs involving the circulatory and respiratory systems: Secondary | ICD-10-CM | POA: Diagnosis not present

## 2021-06-19 DIAGNOSIS — F17210 Nicotine dependence, cigarettes, uncomplicated: Secondary | ICD-10-CM | POA: Diagnosis not present

## 2021-06-19 DIAGNOSIS — Z299 Encounter for prophylactic measures, unspecified: Secondary | ICD-10-CM | POA: Diagnosis not present

## 2021-06-19 DIAGNOSIS — J9811 Atelectasis: Secondary | ICD-10-CM | POA: Diagnosis not present

## 2021-06-19 DIAGNOSIS — R918 Other nonspecific abnormal finding of lung field: Secondary | ICD-10-CM | POA: Diagnosis not present

## 2021-06-19 DIAGNOSIS — I428 Other cardiomyopathies: Secondary | ICD-10-CM | POA: Diagnosis not present

## 2021-06-19 DIAGNOSIS — E1165 Type 2 diabetes mellitus with hyperglycemia: Secondary | ICD-10-CM | POA: Diagnosis not present

## 2021-06-27 DIAGNOSIS — R0981 Nasal congestion: Secondary | ICD-10-CM | POA: Diagnosis not present

## 2021-06-27 DIAGNOSIS — I440 Atrioventricular block, first degree: Secondary | ICD-10-CM | POA: Diagnosis not present

## 2021-06-27 DIAGNOSIS — R059 Cough, unspecified: Secondary | ICD-10-CM | POA: Diagnosis not present

## 2021-06-27 DIAGNOSIS — Z5321 Procedure and treatment not carried out due to patient leaving prior to being seen by health care provider: Secondary | ICD-10-CM | POA: Diagnosis not present

## 2021-06-27 DIAGNOSIS — R9431 Abnormal electrocardiogram [ECG] [EKG]: Secondary | ICD-10-CM | POA: Diagnosis not present

## 2021-06-27 DIAGNOSIS — I493 Ventricular premature depolarization: Secondary | ICD-10-CM | POA: Diagnosis not present

## 2021-06-27 DIAGNOSIS — Z20822 Contact with and (suspected) exposure to covid-19: Secondary | ICD-10-CM | POA: Diagnosis not present

## 2021-06-27 DIAGNOSIS — R0602 Shortness of breath: Secondary | ICD-10-CM | POA: Diagnosis not present

## 2021-07-06 DIAGNOSIS — E119 Type 2 diabetes mellitus without complications: Secondary | ICD-10-CM | POA: Diagnosis not present

## 2021-07-10 DIAGNOSIS — I10 Essential (primary) hypertension: Secondary | ICD-10-CM | POA: Diagnosis not present

## 2021-07-10 DIAGNOSIS — F17210 Nicotine dependence, cigarettes, uncomplicated: Secondary | ICD-10-CM | POA: Diagnosis not present

## 2021-07-10 DIAGNOSIS — I509 Heart failure, unspecified: Secondary | ICD-10-CM | POA: Diagnosis not present

## 2021-07-10 DIAGNOSIS — E1165 Type 2 diabetes mellitus with hyperglycemia: Secondary | ICD-10-CM | POA: Diagnosis not present

## 2021-07-10 DIAGNOSIS — Z299 Encounter for prophylactic measures, unspecified: Secondary | ICD-10-CM | POA: Diagnosis not present

## 2021-08-02 DIAGNOSIS — Z299 Encounter for prophylactic measures, unspecified: Secondary | ICD-10-CM | POA: Diagnosis not present

## 2021-08-02 DIAGNOSIS — I10 Essential (primary) hypertension: Secondary | ICD-10-CM | POA: Diagnosis not present

## 2021-08-02 DIAGNOSIS — F17210 Nicotine dependence, cigarettes, uncomplicated: Secondary | ICD-10-CM | POA: Diagnosis not present

## 2021-08-02 DIAGNOSIS — Z713 Dietary counseling and surveillance: Secondary | ICD-10-CM | POA: Diagnosis not present

## 2021-08-06 DIAGNOSIS — E119 Type 2 diabetes mellitus without complications: Secondary | ICD-10-CM | POA: Diagnosis not present

## 2021-08-09 DIAGNOSIS — I509 Heart failure, unspecified: Secondary | ICD-10-CM | POA: Diagnosis not present

## 2021-08-09 DIAGNOSIS — I10 Essential (primary) hypertension: Secondary | ICD-10-CM | POA: Diagnosis not present

## 2021-08-09 DIAGNOSIS — Z299 Encounter for prophylactic measures, unspecified: Secondary | ICD-10-CM | POA: Diagnosis not present

## 2021-08-09 DIAGNOSIS — F17210 Nicotine dependence, cigarettes, uncomplicated: Secondary | ICD-10-CM | POA: Diagnosis not present

## 2021-08-09 DIAGNOSIS — E1165 Type 2 diabetes mellitus with hyperglycemia: Secondary | ICD-10-CM | POA: Diagnosis not present

## 2021-08-28 DIAGNOSIS — I10 Essential (primary) hypertension: Secondary | ICD-10-CM | POA: Diagnosis not present

## 2021-08-28 DIAGNOSIS — Z299 Encounter for prophylactic measures, unspecified: Secondary | ICD-10-CM | POA: Diagnosis not present

## 2021-08-28 DIAGNOSIS — I509 Heart failure, unspecified: Secondary | ICD-10-CM | POA: Diagnosis not present

## 2021-08-28 DIAGNOSIS — E1165 Type 2 diabetes mellitus with hyperglycemia: Secondary | ICD-10-CM | POA: Diagnosis not present

## 2021-09-05 DIAGNOSIS — E119 Type 2 diabetes mellitus without complications: Secondary | ICD-10-CM | POA: Diagnosis not present

## 2021-09-28 DIAGNOSIS — F17210 Nicotine dependence, cigarettes, uncomplicated: Secondary | ICD-10-CM | POA: Diagnosis not present

## 2021-09-28 DIAGNOSIS — E1165 Type 2 diabetes mellitus with hyperglycemia: Secondary | ICD-10-CM | POA: Diagnosis not present

## 2021-09-28 DIAGNOSIS — I10 Essential (primary) hypertension: Secondary | ICD-10-CM | POA: Diagnosis not present

## 2021-09-28 DIAGNOSIS — I509 Heart failure, unspecified: Secondary | ICD-10-CM | POA: Diagnosis not present

## 2021-09-28 DIAGNOSIS — Z299 Encounter for prophylactic measures, unspecified: Secondary | ICD-10-CM | POA: Diagnosis not present

## 2021-10-05 DIAGNOSIS — E119 Type 2 diabetes mellitus without complications: Secondary | ICD-10-CM | POA: Diagnosis not present

## 2021-11-06 DIAGNOSIS — E119 Type 2 diabetes mellitus without complications: Secondary | ICD-10-CM | POA: Diagnosis not present

## 2021-12-06 DIAGNOSIS — E119 Type 2 diabetes mellitus without complications: Secondary | ICD-10-CM | POA: Diagnosis not present

## 2021-12-07 DIAGNOSIS — E1165 Type 2 diabetes mellitus with hyperglycemia: Secondary | ICD-10-CM | POA: Diagnosis not present

## 2021-12-07 DIAGNOSIS — I10 Essential (primary) hypertension: Secondary | ICD-10-CM | POA: Diagnosis not present

## 2021-12-13 DIAGNOSIS — Z299 Encounter for prophylactic measures, unspecified: Secondary | ICD-10-CM | POA: Diagnosis not present

## 2021-12-13 DIAGNOSIS — I509 Heart failure, unspecified: Secondary | ICD-10-CM | POA: Diagnosis not present

## 2021-12-13 DIAGNOSIS — F17210 Nicotine dependence, cigarettes, uncomplicated: Secondary | ICD-10-CM | POA: Diagnosis not present

## 2021-12-13 DIAGNOSIS — G809 Cerebral palsy, unspecified: Secondary | ICD-10-CM | POA: Diagnosis not present

## 2021-12-13 DIAGNOSIS — E1165 Type 2 diabetes mellitus with hyperglycemia: Secondary | ICD-10-CM | POA: Diagnosis not present

## 2021-12-13 DIAGNOSIS — Z0000 Encounter for general adult medical examination without abnormal findings: Secondary | ICD-10-CM | POA: Diagnosis not present

## 2021-12-13 DIAGNOSIS — I10 Essential (primary) hypertension: Secondary | ICD-10-CM | POA: Diagnosis not present

## 2021-12-13 DIAGNOSIS — Z7189 Other specified counseling: Secondary | ICD-10-CM | POA: Diagnosis not present

## 2022-01-05 DIAGNOSIS — E119 Type 2 diabetes mellitus without complications: Secondary | ICD-10-CM | POA: Diagnosis not present

## 2022-01-10 DIAGNOSIS — Z299 Encounter for prophylactic measures, unspecified: Secondary | ICD-10-CM | POA: Diagnosis not present

## 2022-01-10 DIAGNOSIS — Z0000 Encounter for general adult medical examination without abnormal findings: Secondary | ICD-10-CM | POA: Diagnosis not present

## 2022-01-10 DIAGNOSIS — E1165 Type 2 diabetes mellitus with hyperglycemia: Secondary | ICD-10-CM | POA: Diagnosis not present

## 2022-01-10 DIAGNOSIS — E7800 Pure hypercholesterolemia, unspecified: Secondary | ICD-10-CM | POA: Diagnosis not present

## 2022-01-10 DIAGNOSIS — I10 Essential (primary) hypertension: Secondary | ICD-10-CM | POA: Diagnosis not present

## 2022-01-10 DIAGNOSIS — I509 Heart failure, unspecified: Secondary | ICD-10-CM | POA: Diagnosis not present

## 2022-01-15 DIAGNOSIS — E119 Type 2 diabetes mellitus without complications: Secondary | ICD-10-CM | POA: Diagnosis not present

## 2022-01-15 DIAGNOSIS — H2513 Age-related nuclear cataract, bilateral: Secondary | ICD-10-CM | POA: Diagnosis not present

## 2022-01-15 DIAGNOSIS — Z794 Long term (current) use of insulin: Secondary | ICD-10-CM | POA: Diagnosis not present

## 2022-01-15 DIAGNOSIS — Z7984 Long term (current) use of oral hypoglycemic drugs: Secondary | ICD-10-CM | POA: Diagnosis not present

## 2022-02-05 DIAGNOSIS — E119 Type 2 diabetes mellitus without complications: Secondary | ICD-10-CM | POA: Diagnosis not present

## 2022-03-07 DIAGNOSIS — E119 Type 2 diabetes mellitus without complications: Secondary | ICD-10-CM | POA: Diagnosis not present

## 2022-03-17 ENCOUNTER — Other Ambulatory Visit: Payer: Self-pay | Admitting: Cardiology

## 2022-03-19 NOTE — Telephone Encounter (Signed)
This is a [name] [name] pt.  °

## 2022-04-06 DIAGNOSIS — E119 Type 2 diabetes mellitus without complications: Secondary | ICD-10-CM | POA: Diagnosis not present

## 2022-04-16 DIAGNOSIS — I10 Essential (primary) hypertension: Secondary | ICD-10-CM | POA: Diagnosis not present

## 2022-04-16 DIAGNOSIS — Z299 Encounter for prophylactic measures, unspecified: Secondary | ICD-10-CM | POA: Diagnosis not present

## 2022-04-16 DIAGNOSIS — I428 Other cardiomyopathies: Secondary | ICD-10-CM | POA: Diagnosis not present

## 2022-04-16 DIAGNOSIS — G809 Cerebral palsy, unspecified: Secondary | ICD-10-CM | POA: Diagnosis not present

## 2022-04-16 DIAGNOSIS — E1165 Type 2 diabetes mellitus with hyperglycemia: Secondary | ICD-10-CM | POA: Diagnosis not present

## 2022-04-16 DIAGNOSIS — I509 Heart failure, unspecified: Secondary | ICD-10-CM | POA: Diagnosis not present

## 2022-05-07 DIAGNOSIS — E119 Type 2 diabetes mellitus without complications: Secondary | ICD-10-CM | POA: Diagnosis not present

## 2022-06-06 DIAGNOSIS — E119 Type 2 diabetes mellitus without complications: Secondary | ICD-10-CM | POA: Diagnosis not present

## 2022-07-07 DIAGNOSIS — E119 Type 2 diabetes mellitus without complications: Secondary | ICD-10-CM | POA: Diagnosis not present

## 2022-07-23 DIAGNOSIS — E1165 Type 2 diabetes mellitus with hyperglycemia: Secondary | ICD-10-CM | POA: Diagnosis not present

## 2022-07-23 DIAGNOSIS — I10 Essential (primary) hypertension: Secondary | ICD-10-CM | POA: Diagnosis not present

## 2022-07-23 DIAGNOSIS — Z299 Encounter for prophylactic measures, unspecified: Secondary | ICD-10-CM | POA: Diagnosis not present

## 2022-07-23 DIAGNOSIS — I509 Heart failure, unspecified: Secondary | ICD-10-CM | POA: Diagnosis not present

## 2022-07-23 DIAGNOSIS — M25531 Pain in right wrist: Secondary | ICD-10-CM | POA: Diagnosis not present

## 2022-08-07 DIAGNOSIS — E119 Type 2 diabetes mellitus without complications: Secondary | ICD-10-CM | POA: Diagnosis not present

## 2022-09-07 DIAGNOSIS — E119 Type 2 diabetes mellitus without complications: Secondary | ICD-10-CM | POA: Diagnosis not present

## 2022-10-07 DIAGNOSIS — E119 Type 2 diabetes mellitus without complications: Secondary | ICD-10-CM | POA: Diagnosis not present

## 2022-10-29 DIAGNOSIS — I10 Essential (primary) hypertension: Secondary | ICD-10-CM | POA: Diagnosis not present

## 2022-10-29 DIAGNOSIS — E1165 Type 2 diabetes mellitus with hyperglycemia: Secondary | ICD-10-CM | POA: Diagnosis not present

## 2022-10-29 DIAGNOSIS — M791 Myalgia, unspecified site: Secondary | ICD-10-CM | POA: Diagnosis not present

## 2022-10-29 DIAGNOSIS — Z299 Encounter for prophylactic measures, unspecified: Secondary | ICD-10-CM | POA: Diagnosis not present

## 2022-10-29 DIAGNOSIS — T466X5A Adverse effect of antihyperlipidemic and antiarteriosclerotic drugs, initial encounter: Secondary | ICD-10-CM | POA: Diagnosis not present

## 2022-10-29 DIAGNOSIS — I509 Heart failure, unspecified: Secondary | ICD-10-CM | POA: Diagnosis not present

## 2022-11-26 DIAGNOSIS — R309 Painful micturition, unspecified: Secondary | ICD-10-CM | POA: Diagnosis not present

## 2022-11-26 DIAGNOSIS — Z299 Encounter for prophylactic measures, unspecified: Secondary | ICD-10-CM | POA: Diagnosis not present

## 2022-11-26 DIAGNOSIS — E7800 Pure hypercholesterolemia, unspecified: Secondary | ICD-10-CM | POA: Diagnosis not present

## 2022-12-08 DIAGNOSIS — E119 Type 2 diabetes mellitus without complications: Secondary | ICD-10-CM | POA: Diagnosis not present

## 2022-12-24 DIAGNOSIS — R5383 Other fatigue: Secondary | ICD-10-CM | POA: Diagnosis not present

## 2022-12-24 DIAGNOSIS — Z299 Encounter for prophylactic measures, unspecified: Secondary | ICD-10-CM | POA: Diagnosis not present

## 2022-12-24 DIAGNOSIS — Z0000 Encounter for general adult medical examination without abnormal findings: Secondary | ICD-10-CM | POA: Diagnosis not present

## 2022-12-24 DIAGNOSIS — Z2821 Immunization not carried out because of patient refusal: Secondary | ICD-10-CM | POA: Diagnosis not present

## 2022-12-24 DIAGNOSIS — I509 Heart failure, unspecified: Secondary | ICD-10-CM | POA: Diagnosis not present

## 2022-12-24 DIAGNOSIS — I10 Essential (primary) hypertension: Secondary | ICD-10-CM | POA: Diagnosis not present

## 2022-12-24 DIAGNOSIS — Z7189 Other specified counseling: Secondary | ICD-10-CM | POA: Diagnosis not present

## 2022-12-25 DIAGNOSIS — Z79899 Other long term (current) drug therapy: Secondary | ICD-10-CM | POA: Diagnosis not present

## 2022-12-25 DIAGNOSIS — R5383 Other fatigue: Secondary | ICD-10-CM | POA: Diagnosis not present

## 2022-12-25 DIAGNOSIS — E7800 Pure hypercholesterolemia, unspecified: Secondary | ICD-10-CM | POA: Diagnosis not present

## 2023-02-06 DIAGNOSIS — I10 Essential (primary) hypertension: Secondary | ICD-10-CM | POA: Diagnosis not present

## 2023-02-06 DIAGNOSIS — Z299 Encounter for prophylactic measures, unspecified: Secondary | ICD-10-CM | POA: Diagnosis not present

## 2023-02-06 DIAGNOSIS — G720 Drug-induced myopathy: Secondary | ICD-10-CM | POA: Diagnosis not present

## 2023-02-06 DIAGNOSIS — I509 Heart failure, unspecified: Secondary | ICD-10-CM | POA: Diagnosis not present

## 2023-02-06 DIAGNOSIS — E1165 Type 2 diabetes mellitus with hyperglycemia: Secondary | ICD-10-CM | POA: Diagnosis not present

## 2023-02-06 DIAGNOSIS — I428 Other cardiomyopathies: Secondary | ICD-10-CM | POA: Diagnosis not present

## 2023-02-11 DIAGNOSIS — Z0000 Encounter for general adult medical examination without abnormal findings: Secondary | ICD-10-CM | POA: Diagnosis not present

## 2023-02-11 DIAGNOSIS — I509 Heart failure, unspecified: Secondary | ICD-10-CM | POA: Diagnosis not present

## 2023-02-11 DIAGNOSIS — Z299 Encounter for prophylactic measures, unspecified: Secondary | ICD-10-CM | POA: Diagnosis not present

## 2023-02-11 DIAGNOSIS — E1169 Type 2 diabetes mellitus with other specified complication: Secondary | ICD-10-CM | POA: Diagnosis not present

## 2023-02-11 DIAGNOSIS — I10 Essential (primary) hypertension: Secondary | ICD-10-CM | POA: Diagnosis not present

## 2023-02-11 DIAGNOSIS — G809 Cerebral palsy, unspecified: Secondary | ICD-10-CM | POA: Diagnosis not present

## 2023-05-14 DIAGNOSIS — I10 Essential (primary) hypertension: Secondary | ICD-10-CM | POA: Diagnosis not present

## 2023-05-14 DIAGNOSIS — G5601 Carpal tunnel syndrome, right upper limb: Secondary | ICD-10-CM | POA: Diagnosis not present

## 2023-05-14 DIAGNOSIS — Z299 Encounter for prophylactic measures, unspecified: Secondary | ICD-10-CM | POA: Diagnosis not present

## 2023-05-14 DIAGNOSIS — I509 Heart failure, unspecified: Secondary | ICD-10-CM | POA: Diagnosis not present

## 2023-05-14 DIAGNOSIS — G720 Drug-induced myopathy: Secondary | ICD-10-CM | POA: Diagnosis not present

## 2023-05-14 DIAGNOSIS — E1169 Type 2 diabetes mellitus with other specified complication: Secondary | ICD-10-CM | POA: Diagnosis not present

## 2023-08-21 DIAGNOSIS — E1169 Type 2 diabetes mellitus with other specified complication: Secondary | ICD-10-CM | POA: Diagnosis not present

## 2023-08-21 DIAGNOSIS — I509 Heart failure, unspecified: Secondary | ICD-10-CM | POA: Diagnosis not present

## 2023-08-21 DIAGNOSIS — E1165 Type 2 diabetes mellitus with hyperglycemia: Secondary | ICD-10-CM | POA: Diagnosis not present

## 2023-08-21 DIAGNOSIS — G720 Drug-induced myopathy: Secondary | ICD-10-CM | POA: Diagnosis not present

## 2023-08-21 DIAGNOSIS — Z299 Encounter for prophylactic measures, unspecified: Secondary | ICD-10-CM | POA: Diagnosis not present

## 2023-08-21 DIAGNOSIS — I10 Essential (primary) hypertension: Secondary | ICD-10-CM | POA: Diagnosis not present

## 2023-09-24 DIAGNOSIS — Z299 Encounter for prophylactic measures, unspecified: Secondary | ICD-10-CM | POA: Diagnosis not present

## 2023-09-24 DIAGNOSIS — I509 Heart failure, unspecified: Secondary | ICD-10-CM | POA: Diagnosis not present

## 2023-09-24 DIAGNOSIS — E1169 Type 2 diabetes mellitus with other specified complication: Secondary | ICD-10-CM | POA: Diagnosis not present

## 2023-09-24 DIAGNOSIS — M25559 Pain in unspecified hip: Secondary | ICD-10-CM | POA: Diagnosis not present

## 2023-09-24 DIAGNOSIS — I10 Essential (primary) hypertension: Secondary | ICD-10-CM | POA: Diagnosis not present

## 2023-11-15 NOTE — Progress Notes (Signed)
   11/15/2023  Patient ID: [name] [name], male   DOB: [birth year], 68 y.o.   MRN: [id number]  Reviewed patient regarding medication adherence from a quality report for [facility].    Per [facility] and payer portal fill history:  1. Mounjaro 5 mg - last filled 10/16/23 for a 30-day supply.  2. Lisinopril  20 mg - last filled 10/15/23 for a 90-day supply.  3. Atorvastatin 10 mg - last filled 08/16/23 for a 90-day supply.   I will follow up for adherence monitoring.  Heather Factor, PharmD Clinical Pharmacist  [phone]

## 2023-12-04 DIAGNOSIS — E119 Type 2 diabetes mellitus without complications: Secondary | ICD-10-CM | POA: Diagnosis not present

## 2023-12-04 DIAGNOSIS — Z299 Encounter for prophylactic measures, unspecified: Secondary | ICD-10-CM | POA: Diagnosis not present

## 2023-12-04 DIAGNOSIS — I509 Heart failure, unspecified: Secondary | ICD-10-CM | POA: Diagnosis not present

## 2023-12-04 DIAGNOSIS — I10 Essential (primary) hypertension: Secondary | ICD-10-CM | POA: Diagnosis not present

## 2023-12-25 DIAGNOSIS — E7800 Pure hypercholesterolemia, unspecified: Secondary | ICD-10-CM | POA: Diagnosis not present

## 2023-12-25 DIAGNOSIS — Z0000 Encounter for general adult medical examination without abnormal findings: Secondary | ICD-10-CM | POA: Diagnosis not present

## 2023-12-25 DIAGNOSIS — F17210 Nicotine dependence, cigarettes, uncomplicated: Secondary | ICD-10-CM | POA: Diagnosis not present

## 2023-12-25 DIAGNOSIS — Z79899 Other long term (current) drug therapy: Secondary | ICD-10-CM | POA: Diagnosis not present

## 2023-12-25 DIAGNOSIS — I10 Essential (primary) hypertension: Secondary | ICD-10-CM | POA: Diagnosis not present

## 2023-12-25 DIAGNOSIS — R5383 Other fatigue: Secondary | ICD-10-CM | POA: Diagnosis not present

## 2023-12-25 DIAGNOSIS — Z299 Encounter for prophylactic measures, unspecified: Secondary | ICD-10-CM | POA: Diagnosis not present

## 2023-12-25 DIAGNOSIS — Z7189 Other specified counseling: Secondary | ICD-10-CM | POA: Diagnosis not present
